# Patient Record
Sex: Male | Born: 1970 | Race: White | Hispanic: No | Marital: Married | Smoking: Never smoker
Health system: Southern US, Community
[De-identification: ages and names within clinical notes are randomized; demographics above are authoritative.]

## PROBLEM LIST (undated history)

## (undated) DIAGNOSIS — E785 Hyperlipidemia, unspecified: Secondary | ICD-10-CM

## (undated) DIAGNOSIS — K219 Gastro-esophageal reflux disease without esophagitis: Secondary | ICD-10-CM

## (undated) DIAGNOSIS — E7800 Pure hypercholesterolemia, unspecified: Secondary | ICD-10-CM

## (undated) HISTORY — PX: VEIN LIGATION AND STRIPPING: SHX2653

---

## 1998-09-29 ENCOUNTER — Other Ambulatory Visit: Admission: RE | Admit: 1998-09-29 | Discharge: 1998-09-29 | Payer: Self-pay | Admitting: Otolaryngology

## 2014-10-21 ENCOUNTER — Emergency Department (HOSPITAL_BASED_OUTPATIENT_CLINIC_OR_DEPARTMENT_OTHER): Payer: BC Managed Care – PPO

## 2014-10-21 ENCOUNTER — Observation Stay (HOSPITAL_BASED_OUTPATIENT_CLINIC_OR_DEPARTMENT_OTHER)
Admission: EM | Admit: 2014-10-21 | Discharge: 2014-10-23 | Disposition: A | Discharge status: Home / Self Care | Payer: BC Managed Care – PPO | Attending: Internal Medicine | Admitting: Internal Medicine

## 2014-10-21 ENCOUNTER — Encounter (HOSPITAL_BASED_OUTPATIENT_CLINIC_OR_DEPARTMENT_OTHER): Payer: Self-pay

## 2014-10-21 DIAGNOSIS — R200 Anesthesia of skin: Secondary | ICD-10-CM | POA: Insufficient documentation

## 2014-10-21 DIAGNOSIS — E785 Hyperlipidemia, unspecified: Secondary | ICD-10-CM | POA: Diagnosis not present

## 2014-10-21 DIAGNOSIS — R29898 Other symptoms and signs involving the musculoskeletal system: Secondary | ICD-10-CM | POA: Diagnosis present

## 2014-10-21 DIAGNOSIS — K219 Gastro-esophageal reflux disease without esophagitis: Secondary | ICD-10-CM | POA: Diagnosis not present

## 2014-10-21 DIAGNOSIS — IMO0001 Reserved for inherently not codable concepts without codable children: Secondary | ICD-10-CM | POA: Diagnosis present

## 2014-10-21 DIAGNOSIS — M6281 Muscle weakness (generalized): Principal | ICD-10-CM | POA: Insufficient documentation

## 2014-10-21 DIAGNOSIS — R079 Chest pain, unspecified: Secondary | ICD-10-CM | POA: Diagnosis not present

## 2014-10-21 DIAGNOSIS — G459 Transient cerebral ischemic attack, unspecified: Secondary | ICD-10-CM

## 2014-10-21 DIAGNOSIS — R030 Elevated blood-pressure reading, without diagnosis of hypertension: Secondary | ICD-10-CM

## 2014-10-21 DIAGNOSIS — H53139 Sudden visual loss, unspecified eye: Secondary | ICD-10-CM | POA: Diagnosis present

## 2014-10-21 HISTORY — DX: Pure hypercholesterolemia, unspecified: E78.00

## 2014-10-21 HISTORY — DX: Gastro-esophageal reflux disease without esophagitis: K21.9

## 2014-10-21 HISTORY — DX: Hyperlipidemia, unspecified: E78.5

## 2014-10-21 LAB — COMPREHENSIVE METABOLIC PANEL
ALK PHOS: 90 U/L (ref 39–117)
ALT: 38 U/L (ref 0–53)
ANION GAP: 17 — AB (ref 5–15)
AST: 22 U/L (ref 0–37)
Albumin: 5.1 g/dL (ref 3.5–5.2)
BUN: 15 mg/dL (ref 6–23)
CALCIUM: 10.4 mg/dL (ref 8.4–10.5)
CO2: 27 meq/L (ref 19–32)
Chloride: 100 mEq/L (ref 96–112)
Creatinine, Ser: 1 mg/dL (ref 0.50–1.35)
GLUCOSE: 106 mg/dL — AB (ref 70–99)
Potassium: 4.4 mEq/L (ref 3.7–5.3)
Sodium: 144 mEq/L (ref 137–147)
TOTAL PROTEIN: 8.4 g/dL — AB (ref 6.0–8.3)
Total Bilirubin: 0.6 mg/dL (ref 0.3–1.2)

## 2014-10-21 LAB — LIPASE, BLOOD: Lipase: 28 U/L (ref 11–59)

## 2014-10-21 LAB — CBC WITH DIFFERENTIAL/PLATELET
Basophils Absolute: 0 10*3/uL (ref 0.0–0.1)
Basophils Relative: 0 % (ref 0–1)
Eosinophils Absolute: 0.1 10*3/uL (ref 0.0–0.7)
Eosinophils Relative: 2 % (ref 0–5)
HCT: 42.6 % (ref 39.0–52.0)
Hemoglobin: 14.9 g/dL (ref 13.0–17.0)
LYMPHS ABS: 2.1 10*3/uL (ref 0.7–4.0)
LYMPHS PCT: 31 % (ref 12–46)
MCH: 31.9 pg (ref 26.0–34.0)
MCHC: 35 g/dL (ref 30.0–36.0)
MCV: 91.2 fL (ref 78.0–100.0)
Monocytes Absolute: 0.6 10*3/uL (ref 0.1–1.0)
Monocytes Relative: 9 % (ref 3–12)
Neutro Abs: 4 10*3/uL (ref 1.7–7.7)
Neutrophils Relative %: 58 % (ref 43–77)
Platelets: 209 10*3/uL (ref 150–400)
RBC: 4.67 MIL/uL (ref 4.22–5.81)
RDW: 11.5 % (ref 11.5–15.5)
WBC: 6.9 10*3/uL (ref 4.0–10.5)

## 2014-10-21 LAB — D-DIMER, QUANTITATIVE (NOT AT ARMC)

## 2014-10-21 LAB — URINALYSIS, ROUTINE W REFLEX MICROSCOPIC
Bilirubin Urine: NEGATIVE
Glucose, UA: NEGATIVE mg/dL
HGB URINE DIPSTICK: NEGATIVE
Ketones, ur: 15 mg/dL — AB
Leukocytes, UA: NEGATIVE
Nitrite: NEGATIVE
PH: 5.5 (ref 5.0–8.0)
Protein, ur: NEGATIVE mg/dL
SPECIFIC GRAVITY, URINE: 1.016 (ref 1.005–1.030)
Urobilinogen, UA: 0.2 mg/dL (ref 0.0–1.0)

## 2014-10-21 LAB — RAPID URINE DRUG SCREEN, HOSP PERFORMED
AMPHETAMINES: NOT DETECTED
Barbiturates: NOT DETECTED
Benzodiazepines: NOT DETECTED
Cocaine: NOT DETECTED
OPIATES: NOT DETECTED
Tetrahydrocannabinol: NOT DETECTED

## 2014-10-21 LAB — TROPONIN I: Troponin I: <0.3 ng/mL (ref <0.30)

## 2014-10-21 MED ORDER — ONDANSETRON HCL 4 MG/2ML IJ SOLN: 4.0000 mg | Freq: Three times a day (TID) | INTRAMUSCULAR | Status: AC | PRN

## 2014-10-21 MED ORDER — ASPIRIN 81 MG PO CHEW
324.0000 mg | CHEWABLE_TABLET | Freq: Once | ORAL | Status: AC
  Administered 2014-10-21: 324 mg via ORAL
  Filled 2014-10-21: qty 4

## 2014-10-21 NOTE — ED Notes (Signed)
Abdominal that started on Monday radiating to flank.  He has also had generalized weakness, chest tightness and left arm numbness one hour ago.  States vision changes and nausea.

## 2014-10-21 NOTE — H&P (Signed)
PCP: Reynolds Bowl[facility]ajda [facility] in [place]   Chief Complaint:  Left side weakness, left visual cut and tinnitus  HPI: Chris RutherfordJames M Fernandez is a 43 y.o. male   has a past medical history of Hyperlipidemia; GERD (gastroesophageal reflux disease); and Hypercholesteremia.   Presented with  Patient at 4 PM was driving at the time and started to feel tired, progressively noted left arm tingling and left leg paresthesia. He developed tinnitus and noted left visual cut.  He was having trouble swallowing and felt slightly confused. This has lasted all together for 30 min. [facility] and was given aspirin.  Patient had a mild chest discomfort associated with this which has currently resolved.  Patietn passed swallow eval while [facility].  Reports hx of tachycardia in the past.  Hospitalist was called for admission for  TIA and Chest pain  Review of Systems:    Pertinent positives include:  chest pain, flush, dizziness tingling,   Weakness, confusion  Constitutional:  No weight loss, night sweats, Fevers, chills, fatigue, weight loss  HEENT:  No headaches, Difficulty swallowing,Tooth/dental problems,Sore throat,  No sneezing, itching, ear ache, nasal congestion, post nasal drip,  Cardio-vascular:  No , Orthopnea, PND, anasarca, , palpitations.no Bilateral lower extremity swelling  GI:  No heartburn, indigestion, abdominal pain, nausea, vomiting, diarrhea, change in bowel habits, loss of appetite, melena, blood in stool, hematemesis Resp:  no shortness of breath at rest. No dyspnea on exertion, No excess mucus, no productive cough, No non-productive cough, No coughing up of blood.No change in color of mucus.No wheezing. Skin:  no rash or lesions. No jaundice GU:  no dysuria, change in color of urine, no urgency or frequency. No straining to urinate.  No flank pain.  Musculoskeletal:  No joint pain or no joint swelling. No decreased range of motion. No back pain.    Psych:  No change in mood or affect. No depression or anxiety. No memory loss.  Neuro: no localizing neurological complaints, no  no double vision, no gait abnormality, no slurred speech, no   Otherwise ROS are negative except for above, 10 systems were reviewed  Past Medical History: Past Medical History  Diagnosis Date  . Hyperlipidemia   . GERD (gastroesophageal reflux disease)   . Hypercholesteremia    Past Surgical History  Procedure Laterality Date  . Vein ligation and stripping       Medications: Prior to Admission medications   Medication Sig Start Date End Date Taking? Authorizing Provider  esomeprazole (NEXIUM) 10 MG packet Take 10 mg by mouth daily before breakfast.   Yes Historical Provider, MD    Allergies:  No Known Allergies  Social History:  Ambulatory   independently   Lives at home   With family     reports that he has never smoked. He does not have any smokeless tobacco history on file. He reports that he drinks alcohol. He reports that he does not use illicit drugs.    Family History: family history includes Breast cancer in his mother; Hypertension in his father; Transient ischemic attack in his brother.    Physical Exam: Patient Vitals for the past 24 hrs:  BP Temp Temp src Pulse Resp SpO2 Height Weight  10/21/14 2257 (!) 155/106 mmHg 98.4 F (36.9 C) Oral (!) 113 16 100 % 5\' 10"  (1.778 m) 86.002 kg (189 lb 9.6 oz)  10/21/14 2200 - 98.8 F (37.1 C) - - - - - -  10/21/14 2130 131/87  mmHg - - 89 13 97 % - -  10/21/14 2100 145/91 mmHg - - 97 (!) 9 97 % - -  10/21/14 2030 131/78 mmHg - - 98 15 98 % - -  10/21/14 2000 181/85 mmHg - - 113 13 100 % - -  10/21/14 1930 149/100 mmHg - - 106 - 95 % - -  10/21/14 1915 - - - 106 - 98 % - -  10/21/14 1900 (!) 161/102 mmHg - - 103 - 100 % - -  10/21/14 1821 (!) 172/109 mmHg 98.5 F (36.9 C) Oral 115 20 100 % 5\' 10"  (1.778 m) 86.183 kg (190 lb)    1. General:  in No Acute distress 2. Psychological:  Alert and  Oriented 3. Head/ENT:   Moist  Mucous Membranes                          Head Non traumatic, neck supple                          Normal   Dentition 4. SKIN:  decreased Skin turgor,  Skin clean Dry and intact no rash 5. Heart: Regular rate and rhythm no Murmur, Rub or gallop 6. Lungs: Clear to auscultation bilaterally, no wheezes or crackles   7. Abdomen: Soft, non-tender, Non distended 8. Lower extremities: no clubbing, cyanosis, or edema 9. Neurologically strength slightly diminished on the left grip. No pronator drift. Lower extremity appear to be equally strong. Cranial nerves II through XII intact no visual cuts noticed  10. MSK: Normal range of motion  body mass index is 27.2 kg/(m^2).   Labs on Admission:   Results for orders placed or performed during the hospital encounter of 10/21/14 (from the past 24 hour(s))  CBC with Differential     Status: None   Collection Time: 10/21/14  6:30 PM  Result Value Ref Range   WBC 6.9 4.0 - 10.5 K/uL   RBC 4.67 4.22 - 5.81 MIL/uL   Hemoglobin 14.9 13.0 - 17.0 g/dL   HCT 16.1 09.6 - 04.5 %   MCV 91.2 78.0 - 100.0 fL   MCH 31.9 26.0 - 34.0 pg   MCHC 35.0 30.0 - 36.0 g/dL   RDW 40.9 81.1 - 91.4 %   Platelets 209 150 - 400 K/uL   Neutrophils Relative % 58 43 - 77 %   Neutro Abs 4.0 1.7 - 7.7 K/uL   Lymphocytes Relative 31 12 - 46 %   Lymphs Abs 2.1 0.7 - 4.0 K/uL   Monocytes Relative 9 3 - 12 %   Monocytes Absolute 0.6 0.1 - 1.0 K/uL   Eosinophils Relative 2 0 - 5 %   Eosinophils Absolute 0.1 0.0 - 0.7 K/uL   Basophils Relative 0 0 - 1 %   Basophils Absolute 0.0 0.0 - 0.1 K/uL  Comprehensive metabolic panel     Status: Abnormal   Collection Time: 10/21/14  6:30 PM  Result Value Ref Range   Sodium 144 137 - 147 mEq/L   Potassium 4.4 3.7 - 5.3 mEq/L   Chloride 100 96 - 112 mEq/L   CO2 27 19 - 32 mEq/L   Glucose, Bld 106 (H) 70 - 99 mg/dL   BUN 15 6 - 23 mg/dL   Creatinine, Ser 7.82 0.50 - 1.35 mg/dL   Calcium 95.6 8.4  - 21.3 mg/dL   Total Protein 8.4 (H) 6.0 - 8.3 g/dL  Albumin 5.1 3.5 - 5.2 g/dL   AST 22 0 - 37 U/L   ALT 38 0 - 53 U/L   Alkaline Phosphatase 90 39 - 117 U/L   Total Bilirubin 0.6 0.3 - 1.2 mg/dL   GFR calc non Af Amer >90 >90 mL/min   GFR calc Af Amer >90 >90 mL/min   Anion gap 17 (H) 5 - 15  Lipase, blood     Status: None   Collection Time: 10/21/14  6:30 PM  Result Value Ref Range   Lipase 28 11 - 59 U/L  Troponin I     Status: None   Collection Time: 10/21/14  6:30 PM  Result Value Ref Range   Troponin I <0.30 <0.30 ng/mL  D-dimer, quantitative     Status: None   Collection Time: 10/21/14  6:30 PM  Result Value Ref Range   D-Dimer, Quant <0.27 0.00 - 0.48 ug/mL-FEU  Urinalysis, Routine w reflex microscopic     Status: Abnormal   Collection Time: 10/21/14  6:50 PM  Result Value Ref Range   Color, Urine YELLOW YELLOW   APPearance CLEAR CLEAR   Specific Gravity, Urine 1.016 1.005 - 1.030   pH 5.5 5.0 - 8.0   Glucose, UA NEGATIVE NEGATIVE mg/dL   Hgb urine dipstick NEGATIVE NEGATIVE   Bilirubin Urine NEGATIVE NEGATIVE   Ketones, ur 15 (A) NEGATIVE mg/dL   Protein, ur NEGATIVE NEGATIVE mg/dL   Urobilinogen, UA 0.2 0.0 - 1.0 mg/dL   Nitrite NEGATIVE NEGATIVE   Leukocytes, UA NEGATIVE NEGATIVE  Drug screen panel, emergency     Status: None   Collection Time: 10/21/14  6:50 PM  Result Value Ref Range   Opiates NONE DETECTED NONE DETECTED   Cocaine NONE DETECTED NONE DETECTED   Benzodiazepines NONE DETECTED NONE DETECTED   Amphetamines NONE DETECTED NONE DETECTED   Tetrahydrocannabinol NONE DETECTED NONE DETECTED   Barbiturates NONE DETECTED NONE DETECTED    UA  No evidence of UTI  No results found for: HGBA1C  Estimated Creatinine Clearance: 98.3 mL/min (by C-G formula based on Cr of 1).  BNP (last 3 results) No results for input(s): PROBNP in the last 8760 hours.  Other results:  I have pearsonaly reviewed this: ECG REPORT  Rate: 123  Rhythm: ST ST&T  Change: no ischemic changes   Filed Weights   10/21/14 1821 10/21/14 2257  Weight: 86.183 kg (190 lb) 86.002 kg (189 lb 9.6 oz)     Cultures: No results found for: SDES, SPECREQUEST, CULT, REPTSTATUS   Radiological Exams on Admission: Dg Chest 2 View  10/21/2014   CLINICAL DATA:  43 year old male with abdominal pain radiating into the flank, chest tightness and left arm numbness  EXAM: CHEST  2 VIEW  COMPARISON:  None.  FINDINGS: The lungs are clear and negative for focal airspace consolidation, pulmonary edema or suspicious pulmonary nodule. No pleural effusion or pneumothorax. Cardiac and mediastinal contours are within normal limits. No acute fracture or lytic or blastic osseous lesions. The visualized upper abdominal bowel gas pattern is unremarkable.  IMPRESSION: Negative chest x-ray.   Electronically Signed   By: Malachy MoanHeath  McCullough M.D.   On: 10/21/2014 20:01   Ct Head Wo Contrast  10/21/2014   CLINICAL DATA:  43 year old male with left arm numbness and vision changes  EXAM: CT HEAD WITHOUT CONTRAST  TECHNIQUE: Contiguous axial images were obtained from the base of the skull through the vertex without intravenous contrast.  COMPARISON:  None.  FINDINGS: Negative for  acute intracranial hemorrhage, acute infarction, mass, mass effect, hydrocephalus or midline shift. Gray-white differentiation is preserved throughout. No acute soft tissue or calvarial abnormality. The globes and orbits are symmetric and unremarkable. Normal aeration of the mastoid air cells and visualized paranasal sinuses.  IMPRESSION: Negative head CT.   Electronically Signed   By: Malachy Moan M.D.   On: 10/21/2014 19:58    Chart has been reviewed  Assessment/Plan  43 year old gentleman with family history of TIA early age onset with history of hypercholesteremia presents with transient left-sided weakness and left visual cut currently resolved being evaluated for TIA vs CVA    Present on Admission:  . TIA  (transient ischemic attack) - will admit based on TIA/CVA protocol, await results of MRA/MRI, Carotid Doppler and Echo, obtain cardiac enzymes,  ECG,   Lipid panel, TSH. Order PT/OT evaluation. Will make sure patient is on antiplatelet agent.   Neurology consult.  . Chest pain - atypical but given risk factors will cycle cardiac enzymes  . Vision, loss, sudden - possibly part of TIA currently resolved  . Elevated BP -given long-standing tachycardia will start on the beta blocker. We'll evaluate tachycardia with checking TSH and echogram to start with.    Prophylaxis: SCD , Protonix  CODE STATUS:  FULL CODE   Other plan as per orders.  I have spent a total of 55 min on this admission  Carolanne Mercier 10/21/2014, 11:44 PM  [facility]  Pager [phone]   after 2 AM please page floor coverage PA If 7AM-7PM, please contact the day team taking care of the patient  [email or web address]  Password [email or web address]

## 2014-10-21 NOTE — ED Provider Notes (Signed)
CSN: [id number]     Arrival date & time 10/21/14  1808 History   First MD Initiated Contact with Patient 10/21/14 1812     Chief Complaint  Patient presents with  . Abdominal Pain     (Consider location/radiation/quality/duration/timing/severity/associated sxs/prior Treatment) HPI Comments: Patient is a 43 year old male with a past medical history of hyperlipidemia and GERD who presents with an episode of visual disturbance and left arm weakness and tingling that occurred 1 hour prior to arrival. Patient reports he was driving when he had sudden onset of blurry vision in the left eye. Patient denies complete vision loss. He denies pain at the left eye. At the same time he had sudden onset of left arm tingling, numbness and weakness. These symptoms lasted about 10 minutes before resolving spontaneously. No aggravating/alleviaitng factors.  Patient is also concerned about a 1 week history of abdominal discomfort in the RLQ. The pain is mild and aching with radiation to his right flank. No other associated symptoms.   Patient is a 43 y.o. male presenting with abdominal pain.  Abdominal Pain Associated symptoms: chest pain   Associated symptoms: no chills, no diarrhea, no dysuria, no fatigue, no fever, no nausea, no shortness of breath and no vomiting     Past Medical History  Diagnosis Date  . Hyperlipidemia   . GERD (gastroesophageal reflux disease)    Past Surgical History  Procedure Laterality Date  . Vein ligation and stripping     No family history on file. History  Substance Use Topics  . Smoking status: Never Smoker   . Smokeless tobacco: Not on file  . Alcohol Use: Yes     Comment: occasional    Review of Systems  Constitutional: Negative for fever, chills and fatigue.  HENT: Negative for trouble swallowing.   Eyes: Positive for visual disturbance.  Respiratory: Negative for shortness of breath.   Cardiovascular: Positive for chest pain. Negative for palpitations.   Gastrointestinal: Positive for abdominal pain. Negative for nausea, vomiting and diarrhea.  Genitourinary: Negative for dysuria and difficulty urinating.  Musculoskeletal: Negative for arthralgias and neck pain.  Skin: Negative for color change.  Neurological: Positive for weakness and numbness. Negative for dizziness.  Psychiatric/Behavioral: Negative for dysphoric mood.      Allergies  Review of patient's allergies indicates no known allergies.  Home Medications   Prior to Admission medications   Medication Sig Start Date End Date Taking? Authorizing Provider  esomeprazole (NEXIUM) 10 MG packet Take 10 mg by mouth daily before breakfast.   Yes Historical Provider, MD   BP 172/109 mmHg  Pulse 115  Temp(Src) 98.5 F (36.9 C) (Oral)  Resp 20  Ht 5\' 10"  (1.778 m)  Wt 190 lb (86.183 kg)  BMI 27.26 kg/m2  SpO2 100% Physical Exam  Constitutional: He is oriented to person, place, and time. He appears well-developed and well-nourished. No distress.  HENT:  Head: Normocephalic and atraumatic.  Mouth/Throat: Oropharynx is clear and moist. No oropharyngeal exudate.  Eyes: Conjunctivae and EOM are normal. Pupils are equal, round, and reactive to light.  Neck: Normal range of motion.  Cardiovascular: Normal rate and regular rhythm.  Exam reveals no gallop and no friction rub.   No murmur heard. Pulmonary/Chest: Effort normal and breath sounds normal. He has no wheezes. He has no rales. He exhibits no tenderness.  Abdominal: Soft. He exhibits no distension. There is no tenderness. There is no rebound.  Musculoskeletal: Normal range of motion.  Neurological: He is alert  and oriented to person, place, and time. No cranial nerve deficit. Coordination normal.  Extremity strength and sensation equal and intact bilaterally. Speech is goal-oriented. Moves limbs without ataxia.   Skin: Skin is warm and dry.  Psychiatric: He has a normal mood and affect. His behavior is normal.  Nursing note  and vitals reviewed.   ED Course  Procedures (including critical care time) Labs Review Labs Reviewed  URINALYSIS, ROUTINE W REFLEX MICROSCOPIC - Abnormal; Notable for the following:    Ketones, ur 15 (*)    All other components within normal limits  COMPREHENSIVE METABOLIC PANEL - Abnormal; Notable for the following:    Glucose, Bld 106 (*)    Total Protein 8.4 (*)    Anion gap 17 (*)    All other components within normal limits  CBC WITH DIFFERENTIAL  LIPASE, BLOOD  TROPONIN I  URINE RAPID DRUG SCREEN (HOSP PERFORMED)  D-DIMER, QUANTITATIVE    Imaging Review Dg Chest 2 View  10/21/2014   CLINICAL DATA:  43 year old male with abdominal pain radiating into the flank, chest tightness and left arm numbness  EXAM: CHEST  2 VIEW  COMPARISON:  None.  FINDINGS: The lungs are clear and negative for focal airspace consolidation, pulmonary edema or suspicious pulmonary nodule. No pleural effusion or pneumothorax. Cardiac and mediastinal contours are within normal limits. No acute fracture or lytic or blastic osseous lesions. The visualized upper abdominal bowel gas pattern is unremarkable.  IMPRESSION: Negative chest x-ray.   Electronically Signed   By: Malachy Moan M.D.   On: 10/21/2014 20:01   Ct Head Wo Contrast  10/21/2014   CLINICAL DATA:  43 year old male with left arm numbness and vision changes  EXAM: CT HEAD WITHOUT CONTRAST  TECHNIQUE: Contiguous axial images were obtained from the base of the skull through the vertex without intravenous contrast.  COMPARISON:  None.  FINDINGS: Negative for acute intracranial hemorrhage, acute infarction, mass, mass effect, hydrocephalus or midline shift. Gray-white differentiation is preserved throughout. No acute soft tissue or calvarial abnormality. The globes and orbits are symmetric and unremarkable. Normal aeration of the mastoid air cells and visualized paranasal sinuses.  IMPRESSION: Negative head CT.   Electronically Signed   By: Malachy Moan M.D.   On: 10/21/2014 19:58   Mri Brain Without Contrast  10/22/2014   CLINICAL DATA:  Episode of left arm tingling and left leg paresthesia with tinnitus and left visual cut yesterday, lasting for approximately 30 min. History of hyperlipidemia.  EXAM: MRI HEAD WITHOUT CONTRAST  MRA HEAD WITHOUT CONTRAST  TECHNIQUE: Multiplanar, multiecho pulse sequences of the brain and surrounding structures were obtained without intravenous contrast. Angiographic images of the head were obtained using MRA technique without contrast.  COMPARISON:  Head CT 10/21/2014  FINDINGS: MRI HEAD FINDINGS  There is no evidence of acute infarct, intracranial hemorrhage, mass, midline shift, or extra-axial fluid collection. The ventricles and sulci are normal. There are patchy T2 hyperintensities about the atrium and frontal horn of the right lateral ventricle with several punctate foci of T2 hyperintensity noted subcortically in the frontal lobes.  Orbits are unremarkable. Paranasal sinuses and mastoid air cells are clear. Major intracranial vascular flow voids are preserved.  MRA HEAD FINDINGS  Visualized distal vertebral arteries are patent to the basilar with the left being dominant. PICA origins are patent. Right AICA origin appears patent. SCA origins are patent. Basilar artery is patent without stenosis. PCAs are unremarkable. There are patent, small right and possibly tiny left posterior  communicating arteries.  Internal carotid arteries are patent without stenosis. 1 mm outpouchings along the inferior margins of the supraclinoid ICAs bilaterally are most suggestive of posterior communicating artery region infundibula. ACAs and MCAs are unremarkable. A median artery of the corpus callosum is incidentally noted. No definite intracranial aneurysm is identified.  IMPRESSION: 1. No acute intracranial abnormality. 2. Scattered cerebral white matter T2 signal abnormalities, greatest around the right lateral ventricle. These  are nonspecific but may reflect early chronic small vessel ischemic disease given the history of hyperlipidemia. Other considerations might include sequelae of trauma, hypercoagulable state, vasculitis, migraines, prior infection or demyelination. 3. No evidence of major intracranial arterial occlusion or significant stenosis.   Electronically Signed   By: Sebastian AcheAllen  Grady   On: 10/22/2014 10:36   Mr Maxine GlennMra Head/brain Wo Cm  10/22/2014   CLINICAL DATA:  Episode of left arm tingling and left leg paresthesia with tinnitus and left visual cut yesterday, lasting for approximately 30 min. History of hyperlipidemia.  EXAM: MRI HEAD WITHOUT CONTRAST  MRA HEAD WITHOUT CONTRAST  TECHNIQUE: Multiplanar, multiecho pulse sequences of the brain and surrounding structures were obtained without intravenous contrast. Angiographic images of the head were obtained using MRA technique without contrast.  COMPARISON:  Head CT 10/21/2014  FINDINGS: MRI HEAD FINDINGS  There is no evidence of acute infarct, intracranial hemorrhage, mass, midline shift, or extra-axial fluid collection. The ventricles and sulci are normal. There are patchy T2 hyperintensities about the atrium and frontal horn of the right lateral ventricle with several punctate foci of T2 hyperintensity noted subcortically in the frontal lobes.  Orbits are unremarkable. Paranasal sinuses and mastoid air cells are clear. Major intracranial vascular flow voids are preserved.  MRA HEAD FINDINGS  Visualized distal vertebral arteries are patent to the basilar with the left being dominant. PICA origins are patent. Right AICA origin appears patent. SCA origins are patent. Basilar artery is patent without stenosis. PCAs are unremarkable. There are patent, small right and possibly tiny left posterior communicating arteries.  Internal carotid arteries are patent without stenosis. 1 mm outpouchings along the inferior margins of the supraclinoid ICAs bilaterally are most suggestive of  posterior communicating artery region infundibula. ACAs and MCAs are unremarkable. A median artery of the corpus callosum is incidentally noted. No definite intracranial aneurysm is identified.  IMPRESSION: 1. No acute intracranial abnormality. 2. Scattered cerebral white matter T2 signal abnormalities, greatest around the right lateral ventricle. These are nonspecific but may reflect early chronic small vessel ischemic disease given the history of hyperlipidemia. Other considerations might include sequelae of trauma, hypercoagulable state, vasculitis, migraines, prior infection or demyelination. 3. No evidence of major intracranial arterial occlusion or significant stenosis.   Electronically Signed   By: Sebastian AcheAllen  Grady   On: 10/22/2014 10:36     EKG Interpretation   Date/Time:  Friday October 21 2014 18:14:52 EST Ventricular Rate:  123 PR Interval:  148 QRS Duration: 94 QT Interval:  322 QTC Calculation: 460 R Axis:   53 Text Interpretation:  Sinus tachycardia Otherwise normal ECG No previous  ECGs available Confirmed by RANCOUR  MD, STEPHEN [phone]([id number]) on 10/21/2014  6:21:59 PM      MDM   Final diagnoses:  Chest pain  TIA (transient ischemic attack)    8:58 PM Patient's labs, troponin, chest xray and head CT unremarkable for acute changes. EKG shows sinus tachycardia. Patient tachycardic with remaining labs stable.   Patient will be admitted for possible TIA.    Emilia BeckKaitlyn Gracee Ratterree, PA-C 10/22/14  1610  Glynn Octave, MD 10/23/14 [phone]

## 2014-10-21 NOTE — Progress Notes (Signed)
PENDING ACCEPTANCE TRANFER NOTE:  Call received from: Katelyn PA C.  REASON FOR REQUESTING TRANSFER:  Work up for [employer]>  HPI: 43 yo with transient blurry vx, no HA, and left arm weakness and paresthesia, also complained of nonspecific CP and abdominal pain.  EKG, cardiac marker and head CT was negative.  Concerned about TIA even though patient is quite young.  UDS negative.   PLAN:  According to telephone report, this patient was accepted for transfer to telemetry.  I have requested an order be written to call Flow Manager at [phone] upon patient arrival to the floor for final physician assignment who will do the admission and give admitting orders.  SIGNEHouston Siren: Fiza Nation, MD [facility]  10/21/2014, 9:33 PM

## 2014-10-22 ENCOUNTER — Observation Stay (HOSPITAL_COMMUNITY): Payer: BC Managed Care – PPO

## 2014-10-22 DIAGNOSIS — I517 Cardiomegaly: Secondary | ICD-10-CM

## 2014-10-22 DIAGNOSIS — R200 Anesthesia of skin: Secondary | ICD-10-CM | POA: Diagnosis not present

## 2014-10-22 DIAGNOSIS — IMO0001 Reserved for inherently not codable concepts without codable children: Secondary | ICD-10-CM | POA: Diagnosis present

## 2014-10-22 DIAGNOSIS — M6281 Muscle weakness (generalized): Secondary | ICD-10-CM | POA: Diagnosis not present

## 2014-10-22 DIAGNOSIS — E785 Hyperlipidemia, unspecified: Secondary | ICD-10-CM | POA: Diagnosis not present

## 2014-10-22 DIAGNOSIS — R030 Elevated blood-pressure reading, without diagnosis of hypertension: Secondary | ICD-10-CM

## 2014-10-22 DIAGNOSIS — K219 Gastro-esophageal reflux disease without esophagitis: Secondary | ICD-10-CM | POA: Diagnosis not present

## 2014-10-22 DIAGNOSIS — H53139 Sudden visual loss, unspecified eye: Secondary | ICD-10-CM

## 2014-10-22 LAB — GLUCOSE, CAPILLARY
GLUCOSE-CAPILLARY: 102 mg/dL — AB (ref 70–99)
Glucose-Capillary: 134 mg/dL — ABNORMAL HIGH (ref 70–99)
Glucose-Capillary: 96 mg/dL (ref 70–99)
Glucose-Capillary: 116 mg/dL — ABNORMAL HIGH (ref 70–99)

## 2014-10-22 LAB — HEMOGLOBIN A1C
Hgb A1c MFr Bld: 5.7 % — ABNORMAL HIGH (ref <5.7)
MEAN PLASMA GLUCOSE: 117 mg/dL — AB (ref <117)

## 2014-10-22 LAB — LIPID PANEL
Cholesterol: 219 mg/dL — ABNORMAL HIGH (ref 0–200)
HDL: 49 mg/dL (ref >39)
LDL Cholesterol: 152 mg/dL — ABNORMAL HIGH (ref 0–99)
TRIGLYCERIDES: 91 mg/dL (ref <150)
Total CHOL/HDL Ratio: 4.5 RATIO
VLDL: 18 mg/dL (ref 0–40)

## 2014-10-22 LAB — TROPONIN I
Troponin I: <0.3 ng/mL (ref <0.30)
Troponin I: <0.3 ng/mL (ref <0.30)

## 2014-10-22 MED ORDER — METOPROLOL TARTRATE 12.5 MG HALF TABLET
12.5000 mg | ORAL_TABLET | Freq: Two times a day (BID) | ORAL | Status: DC
  Administered 2014-10-22 – 2014-10-23 (×3): 12.5 mg via ORAL
  Filled 2014-10-22 (×3): qty 1

## 2014-10-22 MED ORDER — SODIUM CHLORIDE 0.9 % IV SOLN
INTRAVENOUS | Status: AC
  Administered 2014-10-22: 02:00:00 via INTRAVENOUS

## 2014-10-22 MED ORDER — ASPIRIN 325 MG PO TABS
325.0000 mg | ORAL_TABLET | Freq: Every day | ORAL | Status: DC
  Administered 2014-10-22 – 2014-10-23 (×2): 325 mg via ORAL
  Filled 2014-10-22 (×2): qty 1

## 2014-10-22 MED ORDER — STROKE: EARLY STAGES OF RECOVERY BOOK
Freq: Once | Status: AC
  Administered 2014-10-22: 01:00:00
  Filled 2014-10-22: qty 1

## 2014-10-22 MED ORDER — PANTOPRAZOLE SODIUM 40 MG PO TBEC
40.0000 mg | DELAYED_RELEASE_TABLET | Freq: Every day | ORAL | Status: DC
  Administered 2014-10-22: 40 mg via ORAL
  Filled 2014-10-22: qty 1

## 2014-10-22 MED ORDER — ACETAMINOPHEN 325 MG PO TABS
650.0000 mg | ORAL_TABLET | ORAL | Status: DC | PRN
  Administered 2014-10-22: 650 mg via ORAL
  Filled 2014-10-22: qty 2

## 2014-10-22 MED ORDER — ATORVASTATIN CALCIUM 40 MG PO TABS
40.0000 mg | ORAL_TABLET | Freq: Every day | ORAL | Status: DC
  Administered 2014-10-22: 40 mg via ORAL
  Filled 2014-10-22: qty 1

## 2014-10-22 MED ORDER — DIPHENHYDRAMINE HCL 25 MG PO CAPS
25.0000 mg | ORAL_CAPSULE | Freq: Once | ORAL | Status: AC
  Administered 2014-10-22: 25 mg via ORAL
  Filled 2014-10-22: qty 1

## 2014-10-22 NOTE — Progress Notes (Signed)
[facility] PROGRESS NOTE  Assessment/Plan: TIA (transient ischemic attack)/Vision, loss, sudden: - Hgb A1c pending, fasting lipid panel LDL > 100, start statins - MRI, MRA of the brain without contrast no acute CVA. - Echocardiogram , Carotid dopplers pending. - Prophylactic therapy-Antiplatelet med: Aspirin - dose 325 mg PO daily  - risk factor modification  - Cardiac Monitoring no events. - tobacco counseling cessation.  Atypical Chest pain - unlikely cardiac.    Elevated BP - Improving without medications.   Code Status: full Family Communication: none  Disposition Plan: obsevration   Consultants:  neruology  Procedures: MRI/MRA : No acute intracranial abnormality. 2. Scattered cerebral white matter T2 signal abnormalities, greatest around the right lateral ventricle. These are nonspecific but may reflect early chronic small vessel ischemic disease given the history of hyperlipidemia. Other considerations might include sequelae of trauma, hypercoagulable state, vasculitis, migraines, prior infection or demyelination.  No evidence of major intracranial arterial occlusion or significant stenosis.  Antibiotics:  None  HPI/Subjective: symptoms resolved.  Objective: Filed Vitals:   10/21/14 2200 10/21/14 2257 10/22/14 0400 10/22/14 0820  BP:  155/106 142/94 145/94  Pulse:  113 83 90  Temp: 98.8 F (37.1 C) 98.4 F (36.9 C) 98.5 F (36.9 C) 99 F (37.2 C)  TempSrc:  Oral Oral Oral  Resp:  16 18 18   Height:  5\' 10"  (1.778 m)    Weight:  86.002 kg (189 lb 9.6 oz) 86.002 kg (189 lb 9.6 oz)   SpO2:  100% 99% 98%    Intake/Output Summary (Last 24 hours) at 10/22/14 1123 Last data filed at 10/22/14 0800  Gross per 24 hour  Intake 648.75 ml  Output   1925 ml  Net -1276.25 ml   Filed Weights   10/21/14 1821 10/21/14 2257 10/22/14 0400  Weight: 86.183 kg (190 lb) 86.002 kg (189 lb 9.6 oz) 86.002 kg (189 lb 9.6 oz)    Exam:  General: Alert, awake,  oriented x3, in no acute distress.  HEENT: No bruits, no goiter.  Heart: Regular rate and rhythm. Lungs: Good air movement, clear Abdomen: Soft, nontender, nondistended, positive bowel sounds.    Data Reviewed: Basic Metabolic Panel:  Recent Labs Lab 10/21/14 1830  NA 144  K 4.4  CL 100  CO2 27  GLUCOSE 106*  BUN 15  CREATININE 1.00  CALCIUM 10.4   Liver Function Tests:  Recent Labs Lab 10/21/14 1830  AST 22  ALT 38  ALKPHOS 90  BILITOT 0.6  PROT 8.4*  ALBUMIN 5.1    Recent Labs Lab 10/21/14 1830  LIPASE 28   No results for input(s): AMMONIA in the last 168 hours. CBC:  Recent Labs Lab 10/21/14 1830  WBC 6.9  NEUTROABS 4.0  HGB 14.9  HCT 42.6  MCV 91.2  PLT 209   Cardiac Enzymes:  Recent Labs Lab 10/21/14 1830 10/22/14 0242 10/22/14 0705  TROPONINI <0.30 <0.30 <0.30   BNP (last 3 results) No results for input(s): PROBNP in the last 8760 hours. CBG:  Recent Labs Lab 10/22/14 0734  GLUCAP 134*    No results found for this or any previous visit (from the past 240 hour(s)).   Studies: Dg Chest 2 View  10/21/2014   CLINICAL DATA:  43 year old male with abdominal pain radiating into the flank, chest tightness and left arm numbness  EXAM: CHEST  2 VIEW  COMPARISON:  None.  FINDINGS: The lungs are clear and negative for focal airspace consolidation, pulmonary edema or suspicious pulmonary nodule.  No pleural effusion or pneumothorax. Cardiac and mediastinal contours are within normal limits. No acute fracture or lytic or blastic osseous lesions. The visualized upper abdominal bowel gas pattern is unremarkable.  IMPRESSION: Negative chest x-ray.   Electronically Signed   By: Malachy MoanHeath  McCullough M.D.   On: 10/21/2014 20:01   Ct Head Wo Contrast  10/21/2014   CLINICAL DATA:  43 year old male with left arm numbness and vision changes  EXAM: CT HEAD WITHOUT CONTRAST  TECHNIQUE: Contiguous axial images were obtained from the base of the skull through  the vertex without intravenous contrast.  COMPARISON:  None.  FINDINGS: Negative for acute intracranial hemorrhage, acute infarction, mass, mass effect, hydrocephalus or midline shift. Gray-white differentiation is preserved throughout. No acute soft tissue or calvarial abnormality. The globes and orbits are symmetric and unremarkable. Normal aeration of the mastoid air cells and visualized paranasal sinuses.  IMPRESSION: Negative head CT.   Electronically Signed   By: Malachy MoanHeath  McCullough M.D.   On: 10/21/2014 19:58   Mri Brain Without Contrast  10/22/2014   CLINICAL DATA:  Episode of left arm tingling and left leg paresthesia with tinnitus and left visual cut yesterday, lasting for approximately 30 min. History of hyperlipidemia.  EXAM: MRI HEAD WITHOUT CONTRAST  MRA HEAD WITHOUT CONTRAST  TECHNIQUE: Multiplanar, multiecho pulse sequences of the brain and surrounding structures were obtained without intravenous contrast. Angiographic images of the head were obtained using MRA technique without contrast.  COMPARISON:  Head CT 10/21/2014  FINDINGS: MRI HEAD FINDINGS  There is no evidence of acute infarct, intracranial hemorrhage, mass, midline shift, or extra-axial fluid collection. The ventricles and sulci are normal. There are patchy T2 hyperintensities about the atrium and frontal horn of the right lateral ventricle with several punctate foci of T2 hyperintensity noted subcortically in the frontal lobes.  Orbits are unremarkable. Paranasal sinuses and mastoid air cells are clear. Major intracranial vascular flow voids are preserved.  MRA HEAD FINDINGS  Visualized distal vertebral arteries are patent to the basilar with the left being dominant. PICA origins are patent. Right AICA origin appears patent. SCA origins are patent. Basilar artery is patent without stenosis. PCAs are unremarkable. There are patent, small right and possibly tiny left posterior communicating arteries.  Internal carotid arteries are patent  without stenosis. 1 mm outpouchings along the inferior margins of the supraclinoid ICAs bilaterally are most suggestive of posterior communicating artery region infundibula. ACAs and MCAs are unremarkable. A median artery of the corpus callosum is incidentally noted. No definite intracranial aneurysm is identified.  IMPRESSION: 1. No acute intracranial abnormality. 2. Scattered cerebral white matter T2 signal abnormalities, greatest around the right lateral ventricle. These are nonspecific but may reflect early chronic small vessel ischemic disease given the history of hyperlipidemia. Other considerations might include sequelae of trauma, hypercoagulable state, vasculitis, migraines, prior infection or demyelination. 3. No evidence of major intracranial arterial occlusion or significant stenosis.   Electronically Signed   By: Sebastian AcheAllen  Grady   On: 10/22/2014 10:36   Mr Maxine GlennMra Head/brain Wo Cm  10/22/2014   CLINICAL DATA:  Episode of left arm tingling and left leg paresthesia with tinnitus and left visual cut yesterday, lasting for approximately 30 min. History of hyperlipidemia.  EXAM: MRI HEAD WITHOUT CONTRAST  MRA HEAD WITHOUT CONTRAST  TECHNIQUE: Multiplanar, multiecho pulse sequences of the brain and surrounding structures were obtained without intravenous contrast. Angiographic images of the head were obtained using MRA technique without contrast.  COMPARISON:  Head CT 10/21/2014  FINDINGS:  MRI HEAD FINDINGS  There is no evidence of acute infarct, intracranial hemorrhage, mass, midline shift, or extra-axial fluid collection. The ventricles and sulci are normal. There are patchy T2 hyperintensities about the atrium and frontal horn of the right lateral ventricle with several punctate foci of T2 hyperintensity noted subcortically in the frontal lobes.  Orbits are unremarkable. Paranasal sinuses and mastoid air cells are clear. Major intracranial vascular flow voids are preserved.  MRA HEAD FINDINGS  Visualized  distal vertebral arteries are patent to the basilar with the left being dominant. PICA origins are patent. Right AICA origin appears patent. SCA origins are patent. Basilar artery is patent without stenosis. PCAs are unremarkable. There are patent, small right and possibly tiny left posterior communicating arteries.  Internal carotid arteries are patent without stenosis. 1 mm outpouchings along the inferior margins of the supraclinoid ICAs bilaterally are most suggestive of posterior communicating artery region infundibula. ACAs and MCAs are unremarkable. A median artery of the corpus callosum is incidentally noted. No definite intracranial aneurysm is identified.  IMPRESSION: 1. No acute intracranial abnormality. 2. Scattered cerebral white matter T2 signal abnormalities, greatest around the right lateral ventricle. These are nonspecific but may reflect early chronic small vessel ischemic disease given the history of hyperlipidemia. Other considerations might include sequelae of trauma, hypercoagulable state, vasculitis, migraines, prior infection or demyelination. 3. No evidence of major intracranial arterial occlusion or significant stenosis.   Electronically Signed   By: Sebastian AcheAllen  Grady   On: 10/22/2014 10:36    Scheduled Meds: . aspirin  325 mg Oral Daily  . metoprolol tartrate  12.5 mg Oral BID  . pantoprazole  40 mg Oral Q1200   Continuous Infusions:    Marinda ElkFELIZ ORTIZ, ABRAHAM  [facility] Pager [phone]. If 8PM-8AM, please contact night-coverage at [email or web address], password [email or web address] 10/22/2014, 11:23 AM  LOS: 1 day

## 2014-10-22 NOTE — Progress Notes (Signed)
INITIAL NUTRITION ASSESSMENT  DOCUMENTATION CODES Per approved criteria  -Not Applicable   INTERVENTION: -Encouraged intake of balanced meals and snacks with appropriate hydration -Encouraged use of meal replacement shakes/supplements as warranted -RD to continue to monitor  NUTRITION DIAGNOSIS: Unintentional wt loss related to inadequate oral intake as evidenced by 4-5 lb wt loss in one week.   Goal: Pt to meet >/= 90% of their estimated nutrition needs    Monitor:  Total protein/energy intake, labs, weights  Reason for Assessment: MST  43 y.o. male  Admitting Dx: <principal problem not specified>  ASSESSMENT: Patient at 4 PM was driving at the time and started to feel tired, progressively noted left arm tingling and left leg paresthesia. He developed tinnitus and noted left visual cut. He was having trouble swallowing and felt slightly confused. This has lasted all together for 30 min. [facility]   -Diet recall noted pt with decreased appetite for one week d/t early satiety. Has been skipping meals and < 75% meal completion -Tolerating regular diet textures and noted improvement in appetite during admit, ate 50% of breakfast -Encouraged use of supplements or small snacks vs skipping meals  --Pt reported unintentional wt loss of 4-5 lbs in one week, which pt expressed concern over. Explained importance of nutrition during illness/infection/inflammatory response, and noted possible difference d/t weight fluctuations -Recommend consistent balanced intake to prevent additional wt loss -Pt verbalized understanding. Declined supplement or snack at this time. -No signs of muscle wasting or fat loss noted  Height: Ht Readings from Last 1 Encounters:  10/21/14 5\' 10"  (1.778 m)    Weight: Wt Readings from Last 1 Encounters:  10/22/14 189 lb 9.6 oz (86.002 kg)    Ideal Body Weight: 166 lb  % Ideal Body Weight: 114%  Wt Readings from Last  10 Encounters:  10/22/14 189 lb 9.6 oz (86.002 kg)    Usual Body Weight: ~194 lb  % Usual Body Weight: 97%  BMI:  Body mass index is 27.2 kg/(m^2).  Estimated Nutritional Needs: Kcal: 2000-2200 Protein: 85-95 gram Fluid: >/= 2000 ml daily  Skin: WDL  Diet Order: Diet regular  EDUCATION NEEDS: -No education needs identified at this time   Intake/Output Summary (Last 24 hours) at 10/22/14 1131 Last data filed at 10/22/14 0800  Gross per 24 hour  Intake 648.75 ml  Output   1925 ml  Net -1276.25 ml    Last BM: 12/11   Labs:   Recent Labs Lab 10/21/14 1830  NA 144  K 4.4  CL 100  CO2 27  BUN 15  CREATININE 1.00  CALCIUM 10.4  GLUCOSE 106*    CBG (last 3)   Recent Labs  10/22/14 0734  GLUCAP 134*    Scheduled Meds: . aspirin  325 mg Oral Daily  . metoprolol tartrate  12.5 mg Oral BID  . pantoprazole  40 mg Oral Q1200    Continuous Infusions:   Past Medical History  Diagnosis Date  . Hyperlipidemia   . GERD (gastroesophageal reflux disease)   . Hypercholesteremia     Past Surgical History  Procedure Laterality Date  . Vein ligation and stripping     Lloyd HugerSarah F Maelyn Berrey MS RD LDN Clinical Dietitian Pager:[phone]

## 2014-10-22 NOTE — Progress Notes (Signed)
VASCULAR LAB PRELIMINARY  PRELIMINARY  PRELIMINARY  PRELIMINARY  Carotid Dopplers completed.    Preliminary report:  There is 1-39% ICA stenosis.  Vertebral artery flow is antegrade.   Daphnee Preiss, RVT 10/22/2014, 4:33 PM

## 2014-10-23 DIAGNOSIS — R29898 Other symptoms and signs involving the musculoskeletal system: Secondary | ICD-10-CM

## 2014-10-23 DIAGNOSIS — H53132 Sudden visual loss, left eye: Secondary | ICD-10-CM

## 2014-10-23 LAB — GLUCOSE, CAPILLARY: Glucose-Capillary: 103 mg/dL — ABNORMAL HIGH (ref 70–99)

## 2014-10-23 MED ORDER — ATORVASTATIN CALCIUM 40 MG PO TABS: 40.0000 mg | ORAL_TABLET | Freq: Every day | ORAL | Status: AC

## 2014-10-23 MED ORDER — ASPIRIN 325 MG PO TABS
ORAL_TABLET | ORAL | Status: AC
  Filled 2014-10-23: qty 1

## 2014-10-23 NOTE — Progress Notes (Signed)
Utilization Review Completed.   Lutricia Widjaja, RN, BSN Nurse Case Manager  

## 2014-10-23 NOTE — Progress Notes (Signed)
Echocardiogram completed.

## 2014-10-23 NOTE — Discharge Summary (Addendum)
Physician Discharge Summary  Genene ChurnJames M Clere RUE:[id number]:[id number] DOB: 05-19-1971 DOA: 10/21/2014  PCP: PROVIDER NOT IN SYSTEM  Admit date: 10/21/2014 Discharge date: 10/23/2014  Time spent: 35 minutes  Recommendations for Outpatient Follow-up:  1. Follow up with PCP  Discharge Diagnoses:  Active Problems:   Left arm weakness   Chest pain   Vision, loss, sudden   Elevated BP   Discharge Condition: stable  Diet recommendation: heart healthy  Filed Weights   10/21/14 2257 10/22/14 0400 10/23/14 0400  Weight: 86.002 kg (189 lb 9.6 oz) 86.002 kg (189 lb 9.6 oz) 86.773 kg (191 lb 4.8 oz)    History of present illness:  43 y.o. male   has a past medical history of Hyperlipidemia; GERD (gastroesophageal reflux disease); and Hypercholesteremia. Presents with feeling tired, progressively noted left arm tingling and left leg paresthesia. He developed tinnitus and noted left visual cut. He was having trouble swallowing and felt slightly confused. This has lasted all together for 30 min. [facility]  Patient had a mild chest discomfort associated with this which has currently resolved.   Hospital Course:  Left arm weakness/Vision, loss, sudden: - Hgb A1c pending, fasting lipid panel LDL > 100, start statins - MRI, MRA of the brain without contrast no acute CVA. - Echocardiogram , Carotid dopplers is an 39% stenosis bilaterally. - Prophylactic therapy-Antiplatelet med: Aspirin - dose 325 mg PO daily  - risk factor modification  - Cardiac Monitoring no events.   Atypical Chest pain - unlikely cardiac. - Cardiac markers negative 3.    Elevated BP - Improving without medications.  Procedures:  MRI of the brain is below  Carotid Dopplers that show no ICA stenosis  Consultations:  None  Discharge Exam: Filed Vitals:   10/23/14 0943  BP: 120/86  Pulse: 84  Temp:   Resp:     General: Awake alert and oriented 3 Cardiovascular:  Irregular rate and rhythm Respiratory: Good air movement clear to auscultation  Discharge Instructions You were cared for by a hospitalist during your hospital stay. If you have any questions about your discharge medications or the care you received while you were in the hospital after you are discharged, you can call the unit and asked to speak with the hospitalist on call if the hospitalist that took care of you is not available. Once you are discharged, your primary care physician will handle any further medical issues. Please note that NO REFILLS for any discharge medications will be authorized once you are discharged, as it is imperative that you return to your primary care physician (or establish a relationship with a primary care physician if you do not have one) for your aftercare needs so that they can reassess your need for medications and monitor your lab values.  Discharge Instructions    Diet - low sodium heart healthy    Complete by:  As directed      Increase activity slowly    Complete by:  As directed           Current Discharge Medication List    START taking these medications   Details  atorvastatin (LIPITOR) 40 MG tablet Take 1 tablet (40 mg total) by mouth daily at 6 PM. Qty: 30 tablet, Refills: 3      CONTINUE these medications which have NOT CHANGED   Details  esomeprazole (NEXIUM) 10 MG packet Take 10 mg by mouth daily before breakfast.    glucosamine-chondroitin 500-400 MG tablet  Take 1 tablet by mouth daily.    Multiple Vitamins-Minerals (MULTIVITAMIN WITH MINERALS) tablet Take 1 tablet by mouth daily.       No Known Allergies    The results of significant diagnostics from this hospitalization (including imaging, microbiology, ancillary and laboratory) are listed below for reference.    Significant Diagnostic Studies: Dg Chest 2 View  10/21/2014   CLINICAL DATA:  43 year old male with abdominal pain radiating into the flank, chest tightness and left  arm numbness  EXAM: CHEST  2 VIEW  COMPARISON:  None.  FINDINGS: The lungs are clear and negative for focal airspace consolidation, pulmonary edema or suspicious pulmonary nodule. No pleural effusion or pneumothorax. Cardiac and mediastinal contours are within normal limits. No acute fracture or lytic or blastic osseous lesions. The visualized upper abdominal bowel gas pattern is unremarkable.  IMPRESSION: Negative chest x-ray.   Electronically Signed   By: Malachy MoanHeath  McCullough M.D.   On: 10/21/2014 20:01   Ct Head Wo Contrast  10/21/2014   CLINICAL DATA:  43 year old male with left arm numbness and vision changes  EXAM: CT HEAD WITHOUT CONTRAST  TECHNIQUE: Contiguous axial images were obtained from the base of the skull through the vertex without intravenous contrast.  COMPARISON:  None.  FINDINGS: Negative for acute intracranial hemorrhage, acute infarction, mass, mass effect, hydrocephalus or midline shift. Gray-white differentiation is preserved throughout. No acute soft tissue or calvarial abnormality. The globes and orbits are symmetric and unremarkable. Normal aeration of the mastoid air cells and visualized paranasal sinuses.  IMPRESSION: Negative head CT.   Electronically Signed   By: Malachy MoanHeath  McCullough M.D.   On: 10/21/2014 19:58   Mri Brain Without Contrast  10/22/2014   CLINICAL DATA:  Episode of left arm tingling and left leg paresthesia with tinnitus and left visual cut yesterday, lasting for approximately 30 min. History of hyperlipidemia.  EXAM: MRI HEAD WITHOUT CONTRAST  MRA HEAD WITHOUT CONTRAST  TECHNIQUE: Multiplanar, multiecho pulse sequences of the brain and surrounding structures were obtained without intravenous contrast. Angiographic images of the head were obtained using MRA technique without contrast.  COMPARISON:  Head CT 10/21/2014  FINDINGS: MRI HEAD FINDINGS  There is no evidence of acute infarct, intracranial hemorrhage, mass, midline shift, or extra-axial fluid collection. The  ventricles and sulci are normal. There are patchy T2 hyperintensities about the atrium and frontal horn of the right lateral ventricle with several punctate foci of T2 hyperintensity noted subcortically in the frontal lobes.  Orbits are unremarkable. Paranasal sinuses and mastoid air cells are clear. Major intracranial vascular flow voids are preserved.  MRA HEAD FINDINGS  Visualized distal vertebral arteries are patent to the basilar with the left being dominant. PICA origins are patent. Right AICA origin appears patent. SCA origins are patent. Basilar artery is patent without stenosis. PCAs are unremarkable. There are patent, small right and possibly tiny left posterior communicating arteries.  Internal carotid arteries are patent without stenosis. 1 mm outpouchings along the inferior margins of the supraclinoid ICAs bilaterally are most suggestive of posterior communicating artery region infundibula. ACAs and MCAs are unremarkable. A median artery of the corpus callosum is incidentally noted. No definite intracranial aneurysm is identified.  IMPRESSION: 1. No acute intracranial abnormality. 2. Scattered cerebral white matter T2 signal abnormalities, greatest around the right lateral ventricle. These are nonspecific but may reflect early chronic small vessel ischemic disease given the history of hyperlipidemia. Other considerations might include sequelae of trauma, hypercoagulable state, vasculitis, migraines, prior infection or demyelination.  3. No evidence of major intracranial arterial occlusion or significant stenosis.   Electronically Signed   By: Sebastian Ache   On: 10/22/2014 10:36   Mr Maxine Glenn Head/brain Wo Cm  10/22/2014   CLINICAL DATA:  Episode of left arm tingling and left leg paresthesia with tinnitus and left visual cut yesterday, lasting for approximately 30 min. History of hyperlipidemia.  EXAM: MRI HEAD WITHOUT CONTRAST  MRA HEAD WITHOUT CONTRAST  TECHNIQUE: Multiplanar, multiecho pulse sequences  of the brain and surrounding structures were obtained without intravenous contrast. Angiographic images of the head were obtained using MRA technique without contrast.  COMPARISON:  Head CT 10/21/2014  FINDINGS: MRI HEAD FINDINGS  There is no evidence of acute infarct, intracranial hemorrhage, mass, midline shift, or extra-axial fluid collection. The ventricles and sulci are normal. There are patchy T2 hyperintensities about the atrium and frontal horn of the right lateral ventricle with several punctate foci of T2 hyperintensity noted subcortically in the frontal lobes.  Orbits are unremarkable. Paranasal sinuses and mastoid air cells are clear. Major intracranial vascular flow voids are preserved.  MRA HEAD FINDINGS  Visualized distal vertebral arteries are patent to the basilar with the left being dominant. PICA origins are patent. Right AICA origin appears patent. SCA origins are patent. Basilar artery is patent without stenosis. PCAs are unremarkable. There are patent, small right and possibly tiny left posterior communicating arteries.  Internal carotid arteries are patent without stenosis. 1 mm outpouchings along the inferior margins of the supraclinoid ICAs bilaterally are most suggestive of posterior communicating artery region infundibula. ACAs and MCAs are unremarkable. A median artery of the corpus callosum is incidentally noted. No definite intracranial aneurysm is identified.  IMPRESSION: 1. No acute intracranial abnormality. 2. Scattered cerebral white matter T2 signal abnormalities, greatest around the right lateral ventricle. These are nonspecific but may reflect early chronic small vessel ischemic disease given the history of hyperlipidemia. Other considerations might include sequelae of trauma, hypercoagulable state, vasculitis, migraines, prior infection or demyelination. 3. No evidence of major intracranial arterial occlusion or significant stenosis.   Electronically Signed   By: Sebastian Ache    On: 10/22/2014 10:36    Microbiology: No results found for this or any previous visit (from the past 240 hour(s)).   Labs: Basic Metabolic Panel:  Recent Labs Lab 10/21/14 1830  NA 144  K 4.4  CL 100  CO2 27  GLUCOSE 106*  BUN 15  CREATININE 1.00  CALCIUM 10.4   Liver Function Tests:  Recent Labs Lab 10/21/14 1830  AST 22  ALT 38  ALKPHOS 90  BILITOT 0.6  PROT 8.4*  ALBUMIN 5.1    Recent Labs Lab 10/21/14 1830  LIPASE 28   No results for input(s): AMMONIA in the last 168 hours. CBC:  Recent Labs Lab 10/21/14 1830  WBC 6.9  NEUTROABS 4.0  HGB 14.9  HCT 42.6  MCV 91.2  PLT 209   Cardiac Enzymes:  Recent Labs Lab 10/21/14 1830 10/22/14 0242 10/22/14 0705 10/22/14 1235  TROPONINI <0.30 <0.30 <0.30 <0.30   BNP: BNP (last 3 results) No results for input(s): PROBNP in the last 8760 hours. CBG:  Recent Labs Lab 10/22/14 0734 10/22/14 1149 10/22/14 1641 10/22/14 2014 10/23/14 0721  GLUCAP 134* 102* 96 116* 103*       Signed:  Marinda Elk  [facility] 10/23/2014, 10:16 AM

## 2014-11-01 ENCOUNTER — Emergency Department (HOSPITAL_BASED_OUTPATIENT_CLINIC_OR_DEPARTMENT_OTHER)
Admission: EM | Admit: 2014-11-01 | Discharge: 2014-11-01 | Disposition: A | Discharge status: Home / Self Care | Payer: BC Managed Care – PPO | Attending: Emergency Medicine | Admitting: Emergency Medicine

## 2014-11-01 ENCOUNTER — Emergency Department (HOSPITAL_BASED_OUTPATIENT_CLINIC_OR_DEPARTMENT_OTHER): Payer: BC Managed Care – PPO

## 2014-11-01 ENCOUNTER — Encounter (HOSPITAL_BASED_OUTPATIENT_CLINIC_OR_DEPARTMENT_OTHER): Payer: Self-pay

## 2014-11-01 DIAGNOSIS — Z79899 Other long term (current) drug therapy: Secondary | ICD-10-CM | POA: Diagnosis not present

## 2014-11-01 DIAGNOSIS — R000 Tachycardia, unspecified: Secondary | ICD-10-CM | POA: Insufficient documentation

## 2014-11-01 DIAGNOSIS — K219 Gastro-esophageal reflux disease without esophagitis: Secondary | ICD-10-CM | POA: Diagnosis not present

## 2014-11-01 DIAGNOSIS — R109 Unspecified abdominal pain: Secondary | ICD-10-CM | POA: Diagnosis present

## 2014-11-01 DIAGNOSIS — E785 Hyperlipidemia, unspecified: Secondary | ICD-10-CM | POA: Diagnosis not present

## 2014-11-01 DIAGNOSIS — K5732 Diverticulitis of large intestine without perforation or abscess without bleeding: Secondary | ICD-10-CM | POA: Insufficient documentation

## 2014-11-01 LAB — URINALYSIS, ROUTINE W REFLEX MICROSCOPIC
Bilirubin Urine: NEGATIVE
Glucose, UA: NEGATIVE mg/dL
HGB URINE DIPSTICK: NEGATIVE
KETONES UR: 15 mg/dL — AB
Leukocytes, UA: NEGATIVE
Nitrite: NEGATIVE
PROTEIN: NEGATIVE mg/dL
Specific Gravity, Urine: 1.005 (ref 1.005–1.030)
UROBILINOGEN UA: 0.2 mg/dL (ref 0.0–1.0)
pH: 6.5 (ref 5.0–8.0)

## 2014-11-01 LAB — COMPREHENSIVE METABOLIC PANEL
ALK PHOS: 83 U/L (ref 39–117)
ALT: 31 U/L (ref 0–53)
ANION GAP: 11 (ref 5–15)
AST: 21 U/L (ref 0–37)
Albumin: 5.3 g/dL — ABNORMAL HIGH (ref 3.5–5.2)
BILIRUBIN TOTAL: 0.5 mg/dL (ref 0.3–1.2)
BUN: 16 mg/dL (ref 6–23)
CHLORIDE: 100 meq/L (ref 96–112)
CO2: 29 mmol/L (ref 19–32)
Calcium: 10.1 mg/dL (ref 8.4–10.5)
Creatinine, Ser: 0.86 mg/dL (ref 0.50–1.35)
GFR calc Af Amer: >90 mL/min (ref >90)
GFR calc non Af Amer: >90 mL/min (ref >90)
Glucose, Bld: 107 mg/dL — ABNORMAL HIGH (ref 70–99)
POTASSIUM: 3.8 mmol/L (ref 3.5–5.1)
Sodium: 140 mmol/L (ref 135–145)
Total Protein: 8.2 g/dL (ref 6.0–8.3)

## 2014-11-01 LAB — CBC WITH DIFFERENTIAL/PLATELET
BASOS PCT: 0 % (ref 0–1)
Basophils Absolute: 0 10*3/uL (ref 0.0–0.1)
Eosinophils Absolute: 0.1 10*3/uL (ref 0.0–0.7)
Eosinophils Relative: 1 % (ref 0–5)
HEMATOCRIT: 41.4 % (ref 39.0–52.0)
Hemoglobin: 14.4 g/dL (ref 13.0–17.0)
Lymphocytes Relative: 19 % (ref 12–46)
Lymphs Abs: 1.3 10*3/uL (ref 0.7–4.0)
MCH: 31.8 pg (ref 26.0–34.0)
MCHC: 34.8 g/dL (ref 30.0–36.0)
MCV: 91.4 fL (ref 78.0–100.0)
MONO ABS: 0.4 10*3/uL (ref 0.1–1.0)
Monocytes Relative: 6 % (ref 3–12)
Neutro Abs: 4.9 10*3/uL (ref 1.7–7.7)
Neutrophils Relative %: 74 % (ref 43–77)
Platelets: 239 10*3/uL (ref 150–400)
RBC: 4.53 MIL/uL (ref 4.22–5.81)
RDW: 11.3 % — AB (ref 11.5–15.5)
WBC: 6.6 10*3/uL (ref 4.0–10.5)

## 2014-11-01 LAB — LIPASE, BLOOD: Lipase: 31 U/L (ref 11–59)

## 2014-11-01 MED ORDER — CIPROFLOXACIN HCL 500 MG PO TABS: 500.0000 mg | ORAL_TABLET | Freq: Two times a day (BID) | ORAL | Status: AC

## 2014-11-01 MED ORDER — IOHEXOL 300 MG/ML  SOLN
100.0000 mL | Freq: Once | INTRAMUSCULAR | Status: AC | PRN
  Administered 2014-11-01: 100 mL via INTRAVENOUS

## 2014-11-01 MED ORDER — METRONIDAZOLE 500 MG PO TABS: 500.0000 mg | ORAL_TABLET | Freq: Three times a day (TID) | ORAL | Status: AC

## 2014-11-01 MED ORDER — SODIUM CHLORIDE 0.9 % IV BOLUS (SEPSIS)
1000.0000 mL | Freq: Once | INTRAVENOUS | Status: AC
  Administered 2014-11-01: 1000 mL via INTRAVENOUS

## 2014-11-01 MED ORDER — IOHEXOL 300 MG/ML  SOLN
25.0000 mL | Freq: Once | INTRAMUSCULAR | Status: AC | PRN
  Administered 2014-11-01: 25 mL via ORAL

## 2014-11-01 NOTE — Discharge Instructions (Signed)

## 2014-11-01 NOTE — ED Provider Notes (Signed)
CSN: [id number]     Arrival date & time 11/01/14  1607 History   First MD Initiated Contact with Patient 11/01/14 1611     Chief Complaint  Patient presents with  . Abdominal Pain     Patient is a 43 y.o. male presenting with abdominal pain. The history is provided by the patient.  Abdominal Pain Associated symptoms: no cough, no dysuria and no shortness of breath    Patient presents for evaluation of abdominal pain. He reports several weeks of central and right-sided abdominal pain that wraps around to his right chest. Symptoms are intermittent. They can be present for a few hours to a day and a half at a time. The pain is described as aching in nature and sharp along the chest wall and burning. There are no alleviating or worsening factors. His pain does not change with breathing, moving, eating or drinking. He has associated reflux type symptoms as well as urinary frequency and cloudy urine. Denies any fevers, cough, shortness of breath, vomiting, diarrhea, dysuria. He was admitted to the hospital 2 weeks ago for a mini stroke. He states there've been no medication changes since that time. He does not take any blood thinners. He has a history of hyperlipidemia and infrequent alcohol use. He denies any injuries..   Past Medical History  Diagnosis Date  . Hyperlipidemia   . GERD (gastroesophageal reflux disease)   . Hypercholesteremia    Past Surgical History  Procedure Laterality Date  . Vein ligation and stripping     Family History  Problem Relation Age of Onset  . Breast cancer Mother   . Hypertension Father   . Transient ischemic attack Brother    History  Substance Use Topics  . Smoking status: Never Smoker   . Smokeless tobacco: Not on file  . Alcohol Use: Yes     Comment: occasional    Review of Systems  Respiratory: Negative for cough and shortness of breath.   Cardiovascular: Negative for palpitations.  Gastrointestinal: Positive for abdominal pain.  Endocrine:  Negative for polydipsia.  Genitourinary: Positive for frequency. Negative for dysuria.  All other systems reviewed and are negative.     Allergies  Review of patient's allergies indicates no known allergies.  Home Medications   Prior to Admission medications   Medication Sig Start Date End Date Taking? Authorizing Provider  atorvastatin (LIPITOR) 40 MG tablet Take 1 tablet (40 mg total) by mouth daily at 6 PM. 10/23/14   Marinda ElkAbraham Feliz Ortiz, MD  esomeprazole (NEXIUM) 10 MG packet Take 10 mg by mouth daily before breakfast.    Historical Provider, MD  glucosamine-chondroitin 500-400 MG tablet Take 1 tablet by mouth daily.    Historical Provider, MD  Multiple Vitamins-Minerals (MULTIVITAMIN WITH MINERALS) tablet Take 1 tablet by mouth daily.    Historical Provider, MD   BP 155/118 mmHg  Pulse 130  Temp(Src) 98.9 F (37.2 C) (Oral)  Resp 16  Ht 5\' 10"  (1.778 m)  Wt 190 lb (86.183 kg)  BMI 27.26 kg/m2  SpO2 100% Physical Exam  Constitutional: He is oriented to person, place, and time. He appears well-developed and well-nourished.  HENT:  Head: Normocephalic and atraumatic.  Cardiovascular:  No murmur heard. Tachycardic  Pulmonary/Chest: Effort normal. No respiratory distress.  Abdominal: Soft. There is no rebound and no guarding.  Mild central and upper abdominal tenderness.  Musculoskeletal: He exhibits no edema or tenderness.  Neurological: He is alert and oriented to person, place, and time.  Skin:  Skin is warm and dry.  Psychiatric: He has a normal mood and affect. His behavior is normal.  Nursing note and vitals reviewed.   ED Course  Procedures (including critical care time) Labs Review Labs Reviewed  COMPREHENSIVE METABOLIC PANEL - Abnormal; Notable for the following:    Glucose, Bld 107 (*)    Albumin 5.3 (*)    All other components within normal limits  CBC WITH DIFFERENTIAL - Abnormal; Notable for the following:    RDW 11.3 (*)    All other components  within normal limits  URINALYSIS, ROUTINE W REFLEX MICROSCOPIC - Abnormal; Notable for the following:    Ketones, ur 15 (*)    All other components within normal limits  LIPASE, BLOOD    Imaging Review Ct Abdomen Pelvis W Contrast  11/01/2014   CLINICAL DATA:  Periumbilical abdominal pain for several weeks.  EXAM: CT ABDOMEN AND PELVIS WITH CONTRAST  TECHNIQUE: Multidetector CT imaging of the abdomen and pelvis was performed using the standard protocol following bolus administration of intravenous contrast.  CONTRAST:  25mL OMNIPAQUE IOHEXOL 300 MG/ML SOLN, 100mL OMNIPAQUE IOHEXOL 300 MG/ML SOLN  COMPARISON:  None.  FINDINGS: Lower Chest:  Unremarkable.  Hepatobiliary: Ovary cm benign hemangioma is seen in the lateral segment of the left hepatic lobe. No other liver masses are identified. Gallbladder is unremarkable.  Pancreas: No mass, inflammatory changes, or other parenchymal abnormality identified.  Spleen:  Within normal limits in size and appearance.  Adrenal Glands:  No mass identified.  Kidneys/Urinary Tract: No masses identified. No evidence of hydronephrosis.  Stomach/Bowel/Peritoneum: Diverticulosis is demonstrated with mild wall thickening and pericolonic soft tissue stranding involving the proximal sigmoid colon, consistent with mild diverticulitis. No evidence of abscess or bowel obstruction.  Vascular/Lymphatic: No pathologically enlarged lymph nodes identified. No other significant abnormality identified.  Reproductive:  No mass or other significant abnormality identified.  Other:  None.  Musculoskeletal:  No suspicious bone lesions identified.  IMPRESSION: Mild sigmoid diverticulitis. No evidence of abscess or other complication.  3 cm benign left hepatic lobe hemangioma incidentally noted.   Electronically Signed   By: Myles RosenthalJohn  Stahl M.D.   On: 11/01/2014 18:24     EKG Interpretation   Date/Time:  Tuesday November 01 2014 16:17:39 EST Ventricular Rate:  124 PR Interval:  150 QRS  Duration: 94 QT Interval:  310 QTC Calculation: 445 R Axis:   55 Text Interpretation:  Sinus tachycardia Otherwise normal ECG Confirmed by  Lincoln Brighamees, Liz [phone]([id number]) on 11/01/2014 5:53:05 PM      MDM   Final diagnoses:  Diverticulitis of large intestine without perforation or abscess without bleeding  Sinus tachycardia    Patient here for evaluation of intermittent abdominal pain. Patient tachycardic in the emergency department, with no evidence of serious bacterial infection. Patient states that when he was admitted to weeks ago for evaluation of TIA he was noted to be tachycardic on that admission as well. Clinical picture is not consistent with ACS or PE. Discussed with patient cardiology follow-up for further evaluation of his tachycardia. Intermittent abdominal pain CT scan is consistent with diverticulitis, we'll treat with Cipro and Flagyl with close PCP follow-up. Return precautions were discussed.    Tilden FossaElizabeth Arzell Mcgeehan, MD 11/01/14 2114

## 2014-11-01 NOTE — ED Notes (Signed)
Patient transported to CT 

## 2014-11-01 NOTE — ED Notes (Signed)
MD at bedside. 

## 2014-11-01 NOTE — ED Notes (Signed)
Reports right side pain, mid abdominal pain and right upper back pain

## 2015-06-12 ENCOUNTER — Inpatient Hospital Stay
Admit: 2015-06-12 | Discharge: 2015-06-12 | Disposition: A | Discharge status: Home / Self Care | Payer: BLUE CROSS/BLUE SHIELD | Attending: Emergency Medicine

## 2015-06-12 DIAGNOSIS — S66326A Laceration of extensor muscle, fascia and tendon of right little finger at wrist and hand level, initial encounter: Secondary | ICD-10-CM

## 2015-06-12 MED ORDER — CEPHALEXIN 250 MG CAP
250 mg | ORAL | Status: AC
Start: 2015-06-12 — End: 2015-06-12
  Administered 2015-06-12: 19:00:00 via ORAL

## 2015-06-12 MED ORDER — NAPROXEN 500 MG TAB
500 mg | ORAL_TABLET | Freq: Two times a day (BID) | ORAL | Status: AC | PRN
Start: 2015-06-12 — End: ?

## 2015-06-12 MED ORDER — OXYCODONE-ACETAMINOPHEN 5 MG-325 MG TAB
5-325 mg | ORAL_TABLET | ORAL | Status: AC | PRN
Start: 2015-06-12 — End: ?

## 2015-06-12 MED ORDER — CEPHALEXIN 500 MG CAP
500 mg | ORAL_CAPSULE | Freq: Four times a day (QID) | ORAL | Status: AC
Start: 2015-06-12 — End: 2015-06-22

## 2015-06-12 MED ORDER — BACITRACIN 500 UNIT/G TOPICAL PACKET
500 unit/gram | CUTANEOUS | Status: AC
Start: 2015-06-12 — End: 2015-06-12
  Administered 2015-06-12: 20:00:00 via TOPICAL

## 2015-06-12 MED ORDER — LIDOCAINE-EPINEPHRINE 1 %-1:100,000 IJ SOLN
1 %-:00,000 | Freq: Once | INTRAMUSCULAR | Status: AC
Start: 2015-06-12 — End: 2015-06-12
  Administered 2015-06-12: 19:00:00 via SUBCUTANEOUS

## 2015-06-12 MED FILL — CEPHALEXIN 250 MG CAP: 250 mg | ORAL | Qty: 2

## 2015-06-12 MED FILL — BACITRACIN 500 UNIT/G TOPICAL PACKET: 500 unit/gram | CUTANEOUS | Qty: 1

## 2015-06-12 MED FILL — LIDOCAINE-EPINEPHRINE 1 %-1:100,000 IJ SOLN: 1 %-:00,000 | INTRAMUSCULAR | Qty: 20

## 2015-06-12 NOTE — ED Notes (Signed)
Alyse PA at bedside to repair laceration.

## 2015-06-12 NOTE — ED Notes (Signed)
Patient given discharge instruction by provider.  Verbalized understanding, pt discharge home with friend

## 2015-06-12 NOTE — ED Provider Notes (Signed)
HPI Comments: 44 yo male here for evaluation of right wrist/arm laceration.  States approximately 40 minutes ago metal edge of a light bulb lacerated arm.  Bleedign controlled; full ROM.     TD UTD per pt.   No additional complaints.      Patient is a 44 y.o. male presenting with skin laceration. The history is provided by the patient.   Laceration   The incident occurred less than 1 hour ago. The laceration is located on the right arm. The laceration is 6 cm in size. The injury mechanism is a metal edge. Pertinent negatives include no numbness, no weakness, no loss of motion, no coolness and no discoloration. The patient's last tetanus shot was less than 5 years ago.       No past medical history on file.    No past surgical history on file.      No family history on file.    History     Social History   ??? Marital Status: N/A     Spouse Name: N/A   ??? Number of Children: N/A   ??? Years of Education: N/A     Occupational History   ??? Not on file.     Social History Main Topics   ??? Smoking status: Not on file   ??? Smokeless tobacco: Not on file   ??? Alcohol Use: Not on file   ??? Drug Use: Not on file   ??? Sexual Activity: Not on file     Other Topics Concern   ??? Not on file     Social History Narrative   ??? No narrative on file         ALLERGIES: Review of patient's allergies indicates no known allergies.    Review of Systems   Constitutional: Negative.    HENT: Negative for ear discharge.    Eyes: Negative for photophobia, pain, discharge and visual disturbance.   Respiratory: Negative for apnea, cough, chest tightness and shortness of breath.    Cardiovascular: Negative for chest pain, palpitations and leg swelling.   Gastrointestinal: Negative for abdominal pain, blood in stool and abdominal distention.   Genitourinary: Negative for dysuria, frequency, hematuria, flank pain and difficulty urinating.   Musculoskeletal: Positive for myalgias. Negative for back pain, joint swelling, gait problem and neck pain.    Skin: Positive for wound. Negative for pallor.   Neurological: Negative for dizziness, syncope, weakness, numbness and headaches.   Psychiatric/Behavioral: Negative for behavioral problems and confusion. The patient is not nervous/anxious.        Filed Vitals:    06/12/15 1441   BP: 154/100   Pulse: 105   Temp: 98 ??F (36.7 ??C)   Resp: 15   Height: 5\' 10"  (1.778 m)   Weight: 86.183 kg (190 lb)   SpO2: 97%            Physical Exam   Constitutional: He is oriented to person, place, and time. He appears well-developed and well-nourished.   HENT:   Head: Normocephalic and atraumatic.   Right Ear: External ear normal.   Left Ear: External ear normal.   Nose: Nose normal.   Mouth/Throat: Oropharynx is clear and moist.   Eyes: Conjunctivae and EOM are normal. Pupils are equal, round, and reactive to light. Right eye exhibits no discharge. Left eye exhibits no discharge.   Neck: Normal range of motion. Neck supple.   Cardiovascular: Normal rate, regular rhythm, normal heart sounds and intact distal pulses.  Pulses:       Radial pulses are 2+ on the right side.   Pulmonary/Chest: Effort normal and breath sounds normal.   Abdominal: Soft. Bowel sounds are normal. He exhibits no distension. There is no tenderness. There is no rebound and no guarding.   Musculoskeletal: Normal range of motion. He exhibits no edema.        Right wrist: He exhibits tenderness and laceration.        Arms:  Lymphadenopathy:     He has no cervical adenopathy.   Neurological: He is alert and oriented to person, place, and time. No cranial nerve deficit. Coordination normal.   Skin: Skin is warm and dry. No rash noted.   Psychiatric: He has a normal mood and affect. His behavior is normal. Judgment and thought content normal.   Nursing note and vitals reviewed.       MDM  Number of Diagnoses or Management Options  Laceration of arm, right, initial encounter:   Laceration of extensor tendon of forearm, right, initial encounter:       Amount and/or Complexity of Data Reviewed  Tests in the radiology section of CPT??: ordered and reviewed  Discuss the patient with other providers: yes  Independent visualization of images, tracings, or specimens: yes        Wound Repair  Date/Time: 06/12/2015 3:30 PM  Performed by: PAPreparation: skin prepped with Betadine, skin prepped with Shur-Clens and sterile field established  Pre-procedure re-eval: Immediately prior to the procedure, the patient was reevaluated and found suitable for the planned procedure and any planned medications.  Location details: right wrist  Wound length:2.6 - 7.5 cm  Anesthesia: local infiltration  Local anesthetic: lidocaine 1% with epinephrine  Anesthetic total: 10 ml  Foreign bodies: no foreign bodies  Irrigation solution: saline  Irrigation method: syringe  Debridement: none  Skin closure: 4-0 nylon (#10)  Number of sutures: 10  Technique: simple and interrupted  Approximation: close  Dressing: antibiotic ointment and pressure dressing  Patient tolerance: Patient tolerated the procedure well with no immediate complications  My total time at bedside, performing this procedure was 16-30 minutes.  Comments: Wound irrigated copiously with betadine/saline. Veverly Fells, PA          Discussed case with attending Physician Joycelyn Rua; in to see.  Agrees with care and will D/C with follow up    Offered to consult ortho for pt followup tomorrow; pt states he is headed back to the [place] tonight where he lives; states he will followup there; aware of necessity.Veverly Fells, PA     Patient's results have been reviewed with them.  Patient and/or family have verbally conveyed their understanding and agreement of the patient's signs, symptoms, diagnosis, treatment and prognosis and additionally agree to follow up as recommended or return to the Emergency Room should their condition change prior to follow-up.  Discharge instructions have also  been provided to the patient with some educational information regarding their diagnosis as well a list of reasons why they would want to return to the ER prior to their follow-up appointment should their condition change.  Veverly Fells, PA

## 2015-06-12 NOTE — ED Notes (Deleted)
Patient in XRAY.

## 2015-06-12 NOTE — ED Notes (Signed)
Patient states that he dropped a light on his right hand about 30 minutes prior and has laceration.   Lac noted to the lateral aspect of the right hand, bleeding controlled with bandage.

## 2015-06-12 NOTE — ED Notes (Signed)
Assumed care of patient.  Introduced self to patient and discussed plan of care and expected timeline of patients stay. Patient advised that medical information would be discussed during their stay and it is their responsibility to excuse any family or friends they do not want present.  Will continue to monitor patient and update them on the plan of care.

## 2016-01-11 IMAGING — MR MR HEAD W/O CM
9 of 11 series · 31 of 48 positions shown · non-contrast
Comparison: Head CT 10/21/2014

CLINICAL DATA: Episode of left arm tingling and left leg
paresthesia with tinnitus and left visual cut yesterday, lasting for
approximately 30 min. History of hyperlipidemia.

EXAM:
MRI HEAD WITHOUT CONTRAST
MRA HEAD WITHOUT CONTRAST
TECHNIQUE: Multiplanar, multiecho pulse sequences of the brain and surrounding
structures were obtained without intravenous contrast. Angiographic
images of the head were obtained using MRA technique without
contrast.

[Series 3: DWI · axial · 3.0mm · 1.09mm/px · z∈[-27,+110]mm · 6 of 94 slices shown (1 of 4)]
[im 1/94]
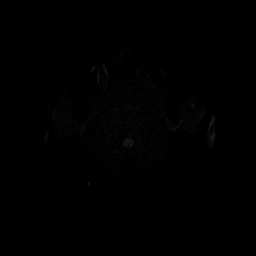
[im 19/94]
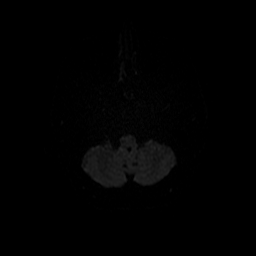
[im 38/94]
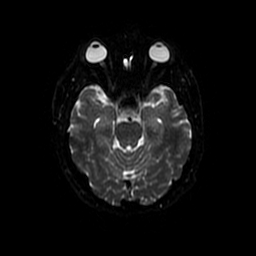
[im 56/94]
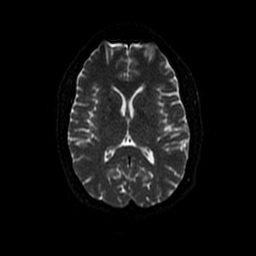
[im 75/94]
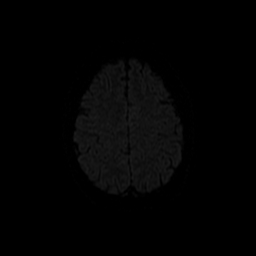
[im 94/94]
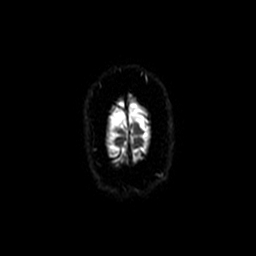

[Series 4: (id) mt fs · axial · 1.2mm · 0.43mm/px · z∈[-37,+21]mm · 5 of 168 slices shown]
[im 1/168]
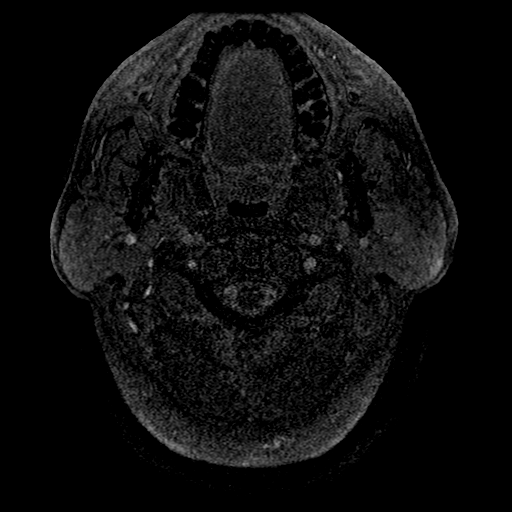
[im 28/168]
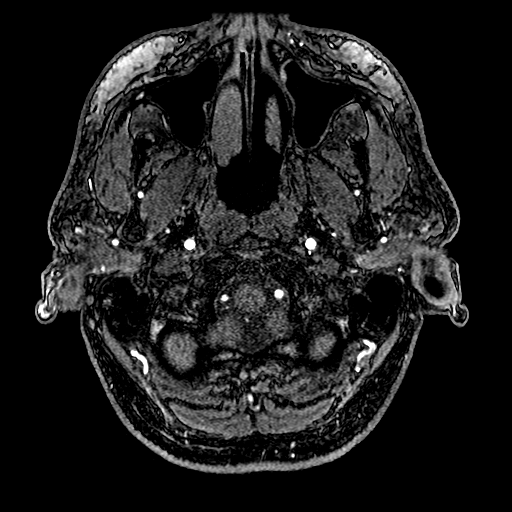
[im 56/168]
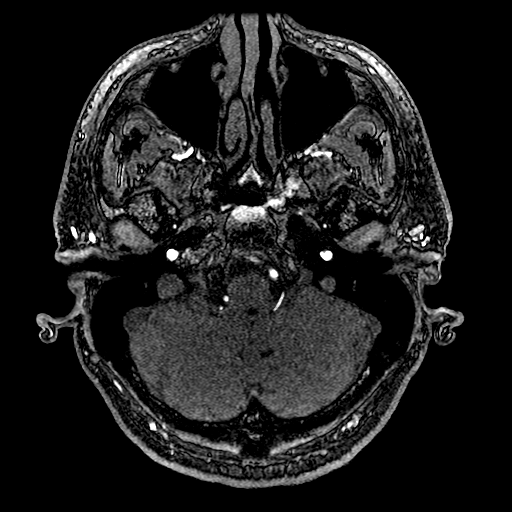
[im 70/168]
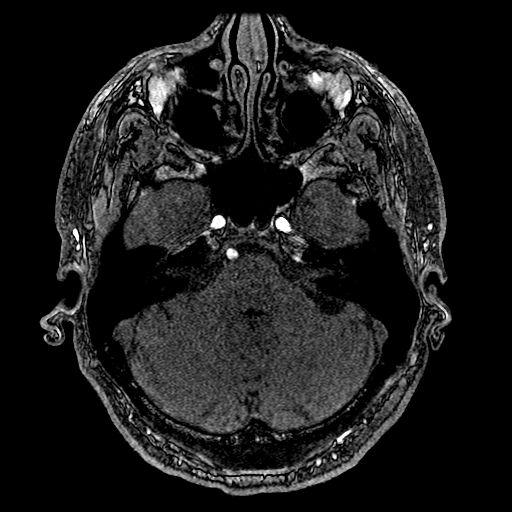
[im 98/168]
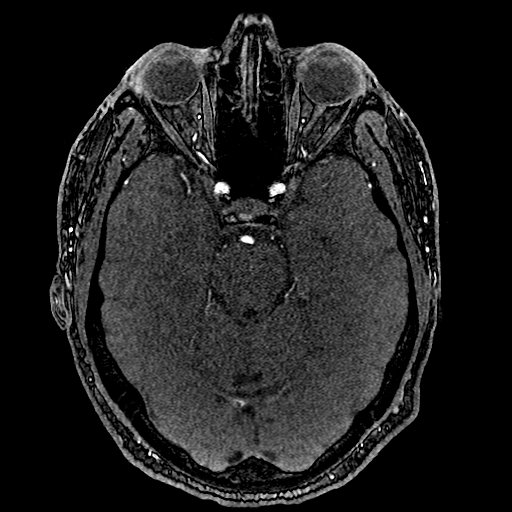

[Series 5: T2 · axial · 5.0mm · 0.43mm/px · z∈[-27,+110]mm · 2 of 24 slices shown (1 of 2)]
[im 1/24]
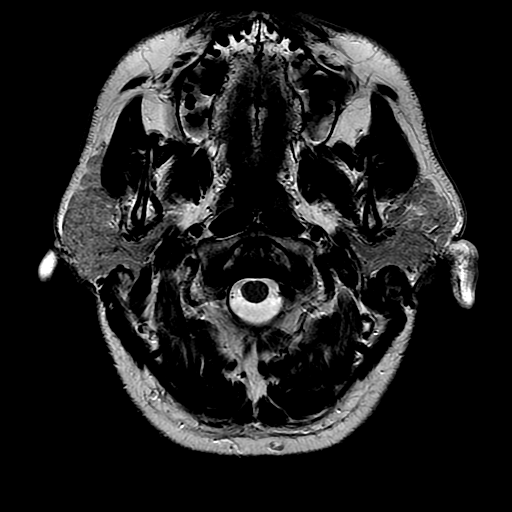
[im 24/24]
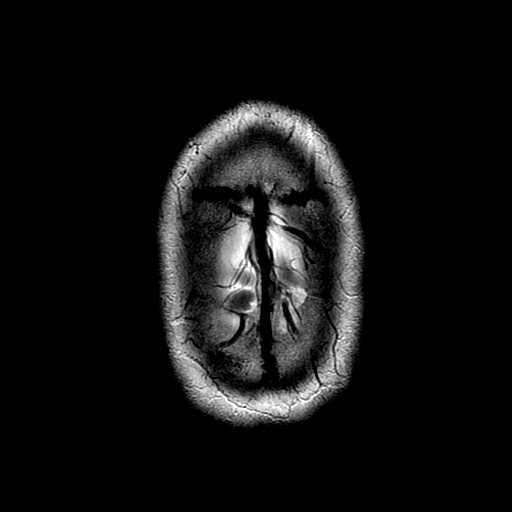

[Series 6: FLAIR · axial · 5.0mm · 0.43mm/px · z∈[-27,+110]mm · 2 of 24 slices shown]
[im 1/24]
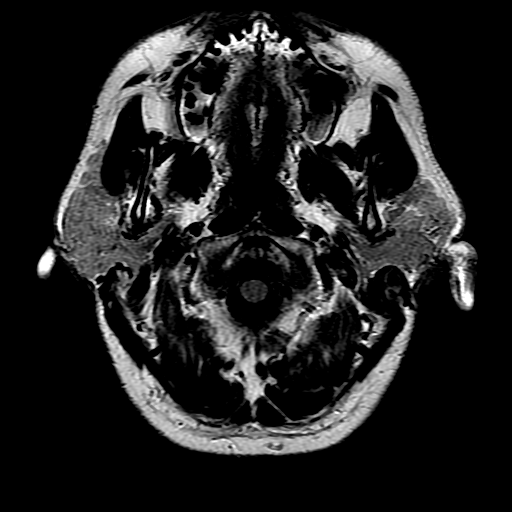
[im 24/24]
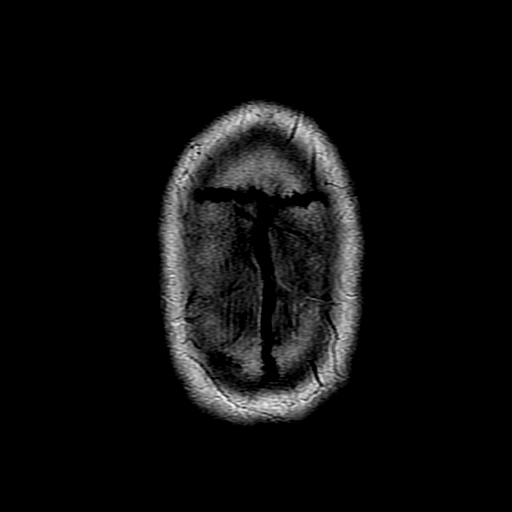

[Series 7: T1 · sagittal · 5.0mm · 0.47mm/px · 2 of 23 slices shown]
[im 1/23]
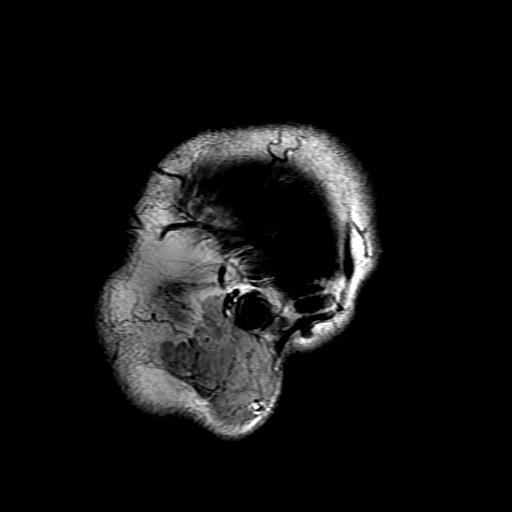
[im 23/23]
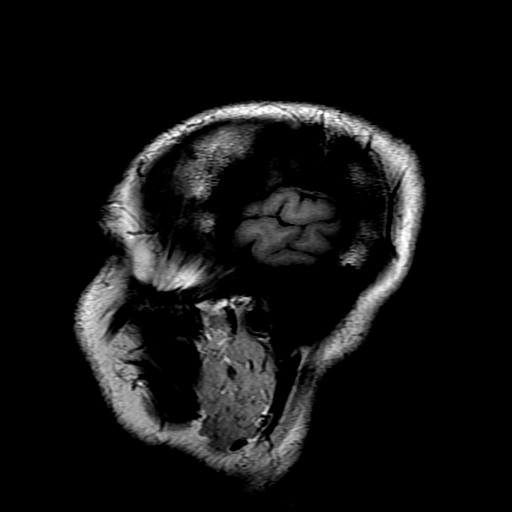

[Series 8: DWI · coronal · 5.0mm · 1.09mm/px · 5 of 72 slices shown (2 of 4)]
[im 1/72]
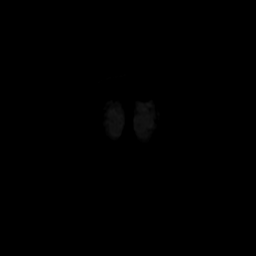
[im 18/72]
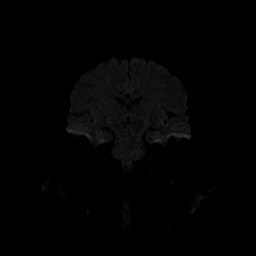
[im 36/72]
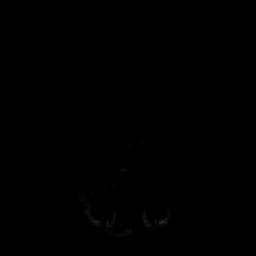
[im 54/72]
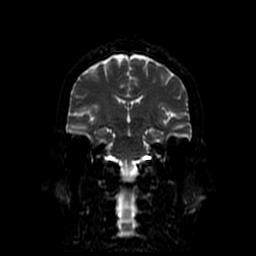
[im 72/72]
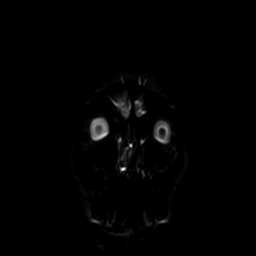

[Series 11: T2 · coronal · 5.0mm · 0.43mm/px · 2 of 29 slices shown (2 of 2)]
[im 1/29]
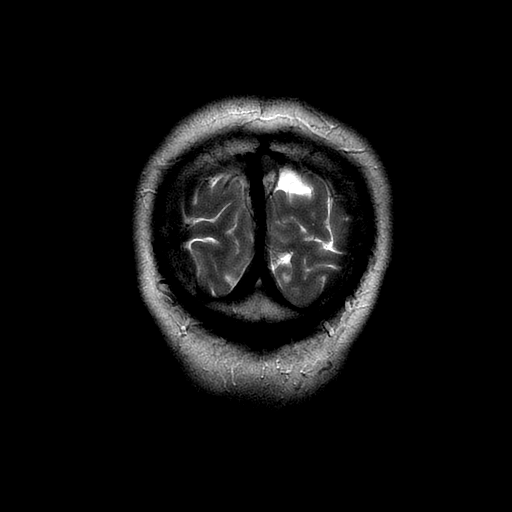
[im 29/29]
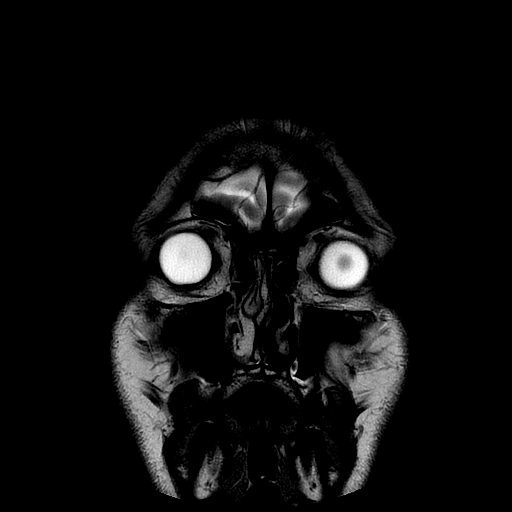

[Series 300: DWI · axial · 3.0mm · 1.09mm/px · z∈[-27,+110]mm · 4 of 47 slices shown (3 of 4)]
[im 1/47]
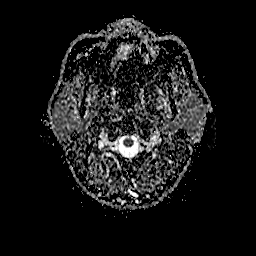
[im 16/47]
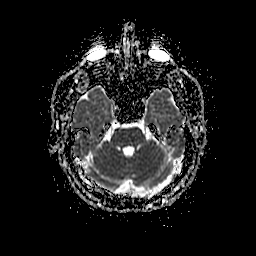
[im 31/47]
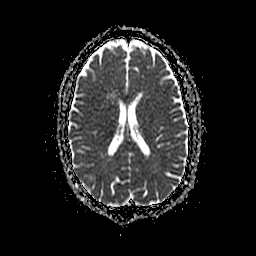
[im 47/47]
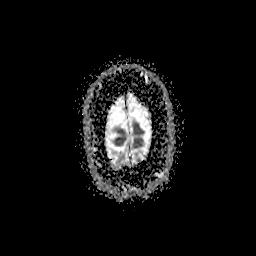

[Series 800: DWI · coronal · 5.0mm · 1.09mm/px · 3 of 36 slices shown (4 of 4)]
[im 1/36]
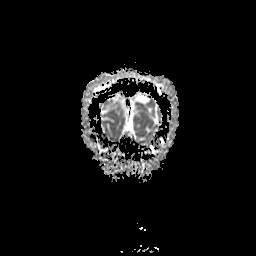
[im 18/36]
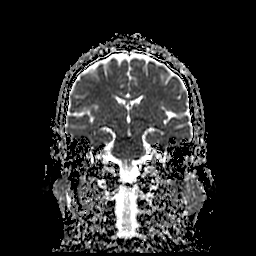
[im 36/36]
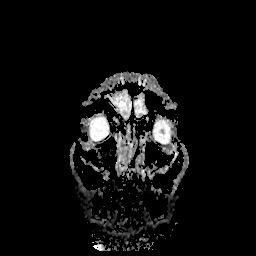

[31 of 48 positions shown; findings below may reference images not displayed]

FINDINGS: MRI HEAD FINDINGS

There is no evidence of acute infarct, intracranial hemorrhage,
mass, midline shift, or extra-axial fluid collection. The ventricles
and sulci are normal. There are patchy T2 hyperintensities about the
atrium and frontal horn of the right lateral ventricle with several
punctate foci of T2 hyperintensity noted subcortically in the
frontal lobes.

Orbits are unremarkable. Paranasal sinuses and mastoid air cells are
clear. Major intracranial vascular flow voids are preserved.

MRA HEAD FINDINGS

Visualized distal vertebral arteries are patent to the basilar with
the left being dominant. PICA origins are patent. Right AICA origin
appears patent. SCA origins are patent. Basilar artery is patent
without stenosis. PCAs are unremarkable. There are patent, small
right and possibly tiny left posterior communicating arteries.

Internal carotid arteries are patent without stenosis. 1 mm
outpouchings along the inferior margins of the supraclinoid ICAs
bilaterally are most suggestive of posterior communicating artery
region infundibula. ACAs and MCAs are unremarkable. A median artery
of the corpus callosum is incidentally noted. No definite
intracranial aneurysm is identified.
IMPRESSION: 1. No acute intracranial abnormality.
2. Scattered cerebral white matter T2 signal abnormalities, greatest
around the right lateral ventricle. These are nonspecific but may
reflect early chronic small vessel ischemic disease given the
history of hyperlipidemia. Other considerations might include
sequelae of trauma, hypercoagulable state, vasculitis, migraines,
prior infection or demyelination.
3. No evidence of major intracranial arterial occlusion or
significant stenosis.
